# Patient Record
Sex: Male | Born: 1960 | Race: White | Hispanic: No | Marital: Single | Smoking: Current every day smoker
Health system: Southern US, Community
[De-identification: ages and names within clinical notes are randomized; demographics above are authoritative.]

## PROBLEM LIST (undated history)

## (undated) DIAGNOSIS — E119 Type 2 diabetes mellitus without complications: Secondary | ICD-10-CM

## (undated) DIAGNOSIS — I10 Essential (primary) hypertension: Secondary | ICD-10-CM

## (undated) DIAGNOSIS — F419 Anxiety disorder, unspecified: Secondary | ICD-10-CM

## (undated) DIAGNOSIS — J45909 Unspecified asthma, uncomplicated: Secondary | ICD-10-CM

## (undated) DIAGNOSIS — I639 Cerebral infarction, unspecified: Secondary | ICD-10-CM

## (undated) DIAGNOSIS — K219 Gastro-esophageal reflux disease without esophagitis: Secondary | ICD-10-CM

## (undated) DIAGNOSIS — J449 Chronic obstructive pulmonary disease, unspecified: Secondary | ICD-10-CM

---

## 2011-11-01 ENCOUNTER — Other Ambulatory Visit: Payer: Self-pay | Admitting: Specialist

## 2011-11-01 DIAGNOSIS — N281 Cyst of kidney, acquired: Secondary | ICD-10-CM

## 2011-11-04 ENCOUNTER — Ambulatory Visit
Admission: RE | Admit: 2011-11-04 | Discharge: 2011-11-04 | Disposition: A | Discharge status: Home / Self Care | Payer: Medicare Other | Source: Ambulatory Visit | Attending: Specialist | Admitting: Specialist

## 2011-11-04 DIAGNOSIS — N281 Cyst of kidney, acquired: Secondary | ICD-10-CM

## 2012-12-18 ENCOUNTER — Encounter (HOSPITAL_COMMUNITY): Payer: Self-pay | Admitting: Emergency Medicine

## 2012-12-18 ENCOUNTER — Other Ambulatory Visit: Payer: Self-pay

## 2012-12-18 ENCOUNTER — Emergency Department (HOSPITAL_COMMUNITY)
Admission: EM | Admit: 2012-12-18 | Discharge: 2012-12-18 | Discharge status: Absent without leave | Payer: Medicare Other | Source: Home / Self Care | Attending: Emergency Medicine | Admitting: Emergency Medicine

## 2012-12-18 ENCOUNTER — Other Ambulatory Visit: Payer: Self-pay | Admitting: Emergency Medicine

## 2012-12-18 ENCOUNTER — Emergency Department (HOSPITAL_COMMUNITY)
Admission: EM | Admit: 2012-12-18 | Discharge: 2012-12-19 | Disposition: A | Discharge status: Home / Self Care | Payer: Medicare Other | Attending: Emergency Medicine | Admitting: Emergency Medicine

## 2012-12-18 DIAGNOSIS — F1920 Other psychoactive substance dependence, uncomplicated: Secondary | ICD-10-CM

## 2012-12-18 DIAGNOSIS — R4789 Other speech disturbances: Secondary | ICD-10-CM | POA: Insufficient documentation

## 2012-12-18 DIAGNOSIS — R739 Hyperglycemia, unspecified: Secondary | ICD-10-CM

## 2012-12-18 DIAGNOSIS — Z79899 Other long term (current) drug therapy: Secondary | ICD-10-CM | POA: Insufficient documentation

## 2012-12-18 DIAGNOSIS — I10 Essential (primary) hypertension: Secondary | ICD-10-CM | POA: Insufficient documentation

## 2012-12-18 DIAGNOSIS — E1169 Type 2 diabetes mellitus with other specified complication: Secondary | ICD-10-CM | POA: Insufficient documentation

## 2012-12-18 DIAGNOSIS — F17200 Nicotine dependence, unspecified, uncomplicated: Secondary | ICD-10-CM | POA: Insufficient documentation

## 2012-12-18 DIAGNOSIS — F411 Generalized anxiety disorder: Secondary | ICD-10-CM | POA: Insufficient documentation

## 2012-12-18 HISTORY — DX: Essential (primary) hypertension: I10

## 2012-12-18 HISTORY — DX: Type 2 diabetes mellitus without complications: E11.9

## 2012-12-18 HISTORY — DX: Anxiety disorder, unspecified: F41.9

## 2012-12-18 LAB — RAPID URINE DRUG SCREEN, HOSP PERFORMED
Amphetamines: NOT DETECTED
Amphetamines: NOT DETECTED
Barbiturates: NOT DETECTED
Benzodiazepines: POSITIVE — AB
Benzodiazepines: POSITIVE — AB
Cocaine: NOT DETECTED
Cocaine: NOT DETECTED
Opiates: POSITIVE — AB
Tetrahydrocannabinol: NOT DETECTED

## 2012-12-18 LAB — GLUCOSE, CAPILLARY: Glucose-Capillary: 387 mg/dL — ABNORMAL HIGH (ref 70–99)

## 2012-12-18 LAB — CBC WITH DIFFERENTIAL/PLATELET
Basophils Absolute: 0 10*3/uL (ref 0.0–0.1)
Basophils Relative: 0 % (ref 0–1)
Eosinophils Relative: 2 % (ref 0–5)
Lymphocytes Relative: 49 % — ABNORMAL HIGH (ref 12–46)
MCHC: 35.5 g/dL (ref 30.0–36.0)
MCV: 82.9 fL (ref 78.0–100.0)
Monocytes Absolute: 0.6 10*3/uL (ref 0.1–1.0)
Neutro Abs: 4.1 10*3/uL (ref 1.7–7.7)
Platelets: 188 10*3/uL (ref 150–400)
RDW: 13.5 % (ref 11.5–15.5)
WBC: 9.5 10*3/uL (ref 4.0–10.5)

## 2012-12-18 LAB — COMPREHENSIVE METABOLIC PANEL
ALT: 27 U/L (ref 0–53)
ALT: 26 U/L (ref 0–53)
AST: 15 U/L (ref 0–37)
AST: 15 U/L (ref 0–37)
Albumin: 3.6 g/dL (ref 3.5–5.2)
Albumin: 3.3 g/dL — ABNORMAL LOW (ref 3.5–5.2)
CO2: 25 mEq/L (ref 19–32)
Calcium: 9.3 mg/dL (ref 8.4–10.5)
Calcium: 9.1 mg/dL (ref 8.4–10.5)
Chloride: 96 mEq/L (ref 96–112)
Creatinine, Ser: 0.67 mg/dL (ref 0.50–1.35)
Creatinine, Ser: 0.64 mg/dL (ref 0.50–1.35)
GFR calc non Af Amer: >90 mL/min (ref >90)
Sodium: 131 mEq/L — ABNORMAL LOW (ref 135–145)
Sodium: 136 mEq/L (ref 135–145)

## 2012-12-18 LAB — URINALYSIS, ROUTINE W REFLEX MICROSCOPIC
Bilirubin Urine: NEGATIVE
Glucose, UA: >1000 mg/dL — AB
Hgb urine dipstick: NEGATIVE
Ketones, ur: NEGATIVE mg/dL
Protein, ur: NEGATIVE mg/dL
Urobilinogen, UA: 0.2 mg/dL (ref 0.0–1.0)

## 2012-12-18 LAB — CBC
MCH: 29.1 pg (ref 26.0–34.0)
MCV: 82.3 fL (ref 78.0–100.0)
Platelets: 202 10*3/uL (ref 150–400)
RBC: 4.81 MIL/uL (ref 4.22–5.81)
RDW: 13.4 % (ref 11.5–15.5)
WBC: 9.7 10*3/uL (ref 4.0–10.5)

## 2012-12-18 LAB — ACETAMINOPHEN LEVEL
Acetaminophen (Tylenol), Serum: <15 ug/mL (ref 10–30)
Acetaminophen (Tylenol), Serum: <15 ug/mL (ref 10–30)

## 2012-12-18 LAB — SALICYLATE LEVEL: Salicylate Lvl: <2 mg/dL — ABNORMAL LOW (ref 2.8–20.0)

## 2012-12-18 MED ORDER — ACETAMINOPHEN 325 MG PO TABS
650.0000 mg | ORAL_TABLET | Freq: Once | ORAL | Status: DC
  Filled 2012-12-18: qty 2

## 2012-12-18 MED ORDER — SODIUM CHLORIDE 0.9 % IV BOLUS (SEPSIS): 1000.0000 mL | Freq: Once | INTRAVENOUS | Status: DC

## 2012-12-18 MED ORDER — SODIUM CHLORIDE 0.9 % IV SOLN: Freq: Once | INTRAVENOUS | Status: DC

## 2012-12-18 NOTE — ED Notes (Signed)
WUJ:[id number]<JY> Expected date:<BR> Expected time:<BR> Means of arrival:<BR> Comments:<BR> EMS-OD

## 2012-12-18 NOTE — ED Notes (Signed)
EKG given to EDP, Ghim, MD.

## 2012-12-18 NOTE — ED Provider Notes (Signed)
CSN: [id number]     Arrival date & time 12/18/12  1916 History     First MD Initiated Contact with Patient 12/18/12 2042     No chief complaint on file.  (Consider location/radiation/quality/duration/timing/severity/associated sxs/prior Treatment) HPI  Patient is a 52 year old male presented to the department for possible drug overdose and ETOH use. Patient states that he took "what he was supposed to take" of his home medications at once last evening without the intent to harm himself. Patient does state that sometimes he does take his home medications for a high. Patient denies any recreational drugs but does endorse alcohol use. Patient is denying any suicidal ideation currently. Denies homicidal ideation, visual or auditory hallucinations. No physical complaints at this time. Patient is a level V Caveat d/t intoxication.  Past Medical History  Diagnosis Date  . Diabetes mellitus without complication   . Hypertension   . Anxiety    History reviewed. No pertinent past surgical history. No family history on file. History  Substance Use Topics  . Smoking status: Current Every Day Smoker    Types: Cigarettes  . Smokeless tobacco: Not on file  . Alcohol Use: Yes     Comment: Unk     Review of Systems  Unable to perform ROS: Other    Allergies  Review of patient's allergies indicates no known allergies.  Home Medications   Current Outpatient Rx  Name  Route  Sig  Dispense  Refill  . ALPRAZolam (XANAX) 1 MG tablet   Oral   Take 1 mg by mouth 2 (two) times daily as needed for anxiety.          Marland Kitchen dexlansoprazole (DEXILANT) 60 MG capsule   Oral   Take 60 mg by mouth daily.         Marland Kitchen HYDROcodone-acetaminophen (NORCO) 10-325 MG per tablet   Oral   Take 1 tablet by mouth 2 (two) times daily.         . metoprolol succinate (TOPROL-XL) 50 MG 24 hr tablet   Oral   Take 50 mg by mouth daily. Take with or immediately following a meal.         . pregabalin (LYRICA) 75  MG capsule   Oral   Take 75 mg by mouth 2 (two) times daily.          BP 132/72  Pulse 80  Temp(Src) 98.1 F (36.7 C) (Oral)  Resp 16  SpO2 97% Physical Exam  Constitutional: He is oriented to person, place, and time. He appears well-developed and well-nourished. No distress.  HENT:  Head: Normocephalic and atraumatic.  Mouth/Throat: Oropharynx is clear and moist.  Eyes: Conjunctivae and EOM are normal. Pupils are equal, round, and reactive to light.  Neck: Neck supple.  Cardiovascular: Normal rate, regular rhythm, normal heart sounds and intact distal pulses.   Pulmonary/Chest: Effort normal and breath sounds normal.  Abdominal: Soft. There is no tenderness.  Neurological: He is alert and oriented to person, place, and time. No cranial nerve deficit.  Skin: Skin is warm and dry. He is not diaphoretic.  Psychiatric: His speech is slurred. He is slowed. He expresses no homicidal and no suicidal ideation. He expresses no suicidal plans and no homicidal plans.    ED Course   Procedures (including critical care time)  Medications  0.9 %  sodium chloride infusion ( Intravenous Stopped 12/18/12 2211)    Labs Reviewed  GLUCOSE, CAPILLARY - Abnormal; Notable for the following:    Glucose-Capillary  363 (*)    All other components within normal limits  CBC WITH DIFFERENTIAL - Abnormal; Notable for the following:    HCT 38.9 (*)    Lymphocytes Relative 49 (*)    Lymphs Abs 4.6 (*)    All other components within normal limits  COMPREHENSIVE METABOLIC PANEL - Abnormal; Notable for the following:    Glucose, Bld 325 (*)    Albumin 3.3 (*)    All other components within normal limits  URINE RAPID DRUG SCREEN (HOSP PERFORMED) - Abnormal; Notable for the following:    Opiates POSITIVE (*)    Benzodiazepines POSITIVE (*)    All other components within normal limits  SALICYLATE LEVEL - Abnormal; Notable for the following:    Salicylate Lvl <2.0 (*)    All other components within  normal limits  URINALYSIS, ROUTINE W REFLEX MICROSCOPIC - Abnormal; Notable for the following:    Specific Gravity, Urine >1.046 (*)    Glucose, UA >1000 (*)    All other components within normal limits  GLUCOSE, CAPILLARY - Abnormal; Notable for the following:    Glucose-Capillary 319 (*)    All other components within normal limits  ETHANOL  ACETAMINOPHEN LEVEL  URINE MICROSCOPIC-ADD ON   No results found. 1. Drug dependence   2. Hyperglycemia without ketosis     MDM  Pt presenting for possible overdose of home medications last evening along with ETOH use. On further evaluation of patient sounds as though he took his medications for recreational purposes and not with suicidal intent. Pt has not endorsed SI at any point during his course in the ED. No concern for SI. ACT consulted on patient and agreed that patient was not suicidal and did not feel need for further evaluation. Patient AAOx4. PE unremarkable. Labs reviewed. Hyperglycemia responded with IVF. No anion gap. Patient clinically sober at time of discharge. Discussed with patient importance of taking his medications as scheduled and not recreationally. Return precautions discussed. Patient is agreeable to plan. Patient d/w with Dr. Fredderick Phenix, agrees with plan.     Lise Auer Rowyn Spilde, PA-C 12/19/12 0101

## 2012-12-18 NOTE — ED Notes (Addendum)
Per EMS pt. From home, pt. Reported by family   to have taken unknown number of different medications since this morning and found pt. Lethargic, slurred speech also noted. Pt. SOB, pt. Oriented to self and what had happened.pt. Admitted of intent of overdosing those medications and hurt himself.

## 2012-12-18 NOTE — ED Notes (Signed)
Contacted Poison Control-pill count of pt's meds noted to be correct-spoke with Deborah-states to monitor and obtain medical clearance labs

## 2012-12-18 NOTE — ED Notes (Signed)
Notified RN, Lilibeth pt. CBG [phone].

## 2012-12-18 NOTE — ED Notes (Signed)
Per EMS, pt. Is from home who was reported by family of taking unknown number of different medications since this morning. Pt. Was found lethargic with slurred speech but no SOB nor pain reported. Pt. Admitted to have intent of hurting himself. No s/s of N/V. Pt. Also claimed of drinking beers and liquor today.

## 2012-12-18 NOTE — ED Notes (Signed)
EKG given to EDP,Belfi,MD. 

## 2012-12-19 ENCOUNTER — Encounter (HOSPITAL_COMMUNITY): Payer: Self-pay | Admitting: *Deleted

## 2012-12-19 NOTE — ED Notes (Signed)
Updated Poison Control Office of pt.'s lab results and condition, suggested to keep pt. Until fully awake and able to ambulate. To notify MD. Pt. Still asleep , easily arouseable to calling but still cant stay awake.

## 2012-12-19 NOTE — BH Assessment (Signed)
Assessment Note   Stephen Mann is a 52 y.o. male who presents to [facility] for possible SI attempt by overdose and alcohol intoxication.  Pt states he's been out of jail since 12/13/12 after serving 4 mos for assault.  Pt says he was supposed to return home and live with family members, but they "turned their backs on him" and were not willing to help him.  Pt admits to harming himself at 2x's in the past by cutting his wrists mainly for attention, but denies any such intent at this time.  Pt states that he'd been drinking a 6 pack of beer before arriving at the [facility].  Pt c/o not having a place to go and that his family is "two-faced".  This Clinical research associate discussed disposition with Marice Potter, PA and pt will be d/c from [facility].     Axis I: Alcohol abuse  Axis II: Deferred Axis III:  Past Medical History  Diagnosis Date  . Diabetes mellitus without complication   . Hypertension   . Anxiety    Axis IV: economic problems, housing problems, occupational problems, other psychosocial or environmental problems, problems related to legal system/crime, problems related to social environment and problems with primary support group Axis V: 61-70 mild symptoms  Past Medical History:  Past Medical History  Diagnosis Date  . Diabetes mellitus without complication   . Hypertension   . Anxiety     History reviewed. No pertinent past surgical history.  Family History: No family history on file.  Social History:  reports that he has been smoking Cigarettes.  He has been smoking about 0.00 packs per day. He does not have any smokeless tobacco history on file. He reports that  drinks alcohol. His drug history is not on file.  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;Legal;Financial Withdrawal Symptoms: Other (Comment) (No w/d sxs ) Substance #1 Name of Substance 1: Alcohol  1 - Age of First Use: Teens  1 - Amount (size/oz): Unk  1 - Frequency: Unk  1 - Duration: On-going  1 - Last Use / Amount: 12/17/12  CIWA: CIWA-Ar BP: 133/58 mmHg Pulse Rate: 84 Nausea and Vomiting: no nausea and no vomiting Tactile Disturbances: none Tremor: no tremor Auditory Disturbances: not present Paroxysmal Sweats: no sweat visible Visual Disturbances: not present Anxiety: no anxiety, at ease Headache, Fullness in Head: none present Agitation: normal activity Orientation and Clouding of Sensorium: oriented and can do serial additions CIWA-Ar Total: 0 COWS:    Allergies: No Known Allergies  Home Medications:  (Not in a hospital admission)  OB/GYN Status:  No LMP for male patient.  General Assessment Data Location of Assessment: [facility] Living Arrangements: Other (Comment) (Homeless ) Can pt return to current living arrangement?: Yes Admission Status: Voluntary Is patient capable of signing voluntary admission?: Yes Transfer from: Acute Hospital Referral Source: MD  Education Status Is patient currently in school?: No Current Grade: None  Highest grade of school patient has completed: None  Name of school: None  Contact person: None   Risk to self Suicidal Ideation: No Suicidal Intent: No Is patient at risk for suicide?: No Suicidal Plan?: No Access to Means: No What has been your use of drugs/alcohol within the last 12 months?: Abusing: alcohol  Previous Attempts/Gestures: No How many times?: 0 Other Self Harm Risks: None  Intentional Self Injurious Behavior: None Family Suicide History: No Recent stressful life event(s): Other (Comment);Financial Problems;Legal Issues (Released from jail 12/13/12; homeless ) Persecutory voices/beliefs?: No Depression: Yes Depression Symptoms: Feeling angry/irritable;Loss of interest in usual  pleasures;Feeling worthless/self pity Substance abuse history and/or treatment for substance abuse?: Yes Suicide prevention information given to non-admitted patients: Not applicable  Risk to Others Homicidal Ideation: No Thoughts of Harm to Others: No Current Homicidal Intent: No Current Homicidal Plan: No Access to Homicidal Means: No Identified Victim: None  History of harm to others?: No Assessment of Violence: None Noted Violent Behavior Description: None  Does patient have access to weapons?: No Criminal Charges Pending?: No Does patient have a court date: No  Psychosis Hallucinations: None noted Delusions: None noted  Mental Status Report Appear/Hygiene: Disheveled;Poor hygiene Eye Contact: Poor Motor Activity: Unremarkable Speech: Logical/coherent;Slurred;Slow Level of Consciousness: Drowsy Mood: Irritable Affect: Irritable Anxiety Level: None Thought Processes: Coherent;Relevant Judgement: Unimpaired Orientation: Person;Place;Time;Situation Obsessive Compulsive Thoughts/Behaviors: None  Cognitive Functioning Concentration: Normal Memory: Recent Intact;Remote Intact IQ: Average Insight: Fair Impulse Control: Fair Appetite: Good Weight Loss: 0 Weight Gain: 0 Sleep: No Change Total Hours of Sleep: 8 Vegetative Symptoms: None  ADLScreening [facility] Assessment Services) Patient's cognitive ability adequate to safely complete daily activities?: Yes Patient able to express need for assistance with ADLs?: Yes Independently performs ADLs?: Yes (appropriate for developmental age)  Abuse/Neglect [facility]) Physical Abuse: Denies Verbal Abuse: Denies Sexual Abuse: Denies  Prior Inpatient Therapy Prior Inpatient Therapy: No Prior Therapy Dates: None  Prior Therapy Facilty/Provider(s): None  Reason for Treatment: None   Prior Outpatient Therapy Prior Outpatient Therapy: No Prior Therapy Dates: None  Prior Therapy Facilty/Provider(s): None  Reason for Treatment:  None   ADL Screening (condition at time of admission) Patient's cognitive ability adequate to safely complete daily activities?: Yes Is the patient deaf or have difficulty hearing?: No Does the patient have difficulty seeing, even when wearing glasses/contacts?: No Does the patient have difficulty concentrating, remembering, or making decisions?: No Patient able to express need for assistance with ADLs?: Yes Does the patient have difficulty dressing or bathing?: No Independently performs ADLs?: Yes (appropriate for developmental age) Does the patient have difficulty walking or climbing stairs?: No Weakness of Legs: None Weakness of Arms/Hands: None  Home Assistive Devices/Equipment Home Assistive Devices/Equipment: None  Therapy Consults (therapy consults require a physician order) PT Evaluation Needed: No OT Evalulation Needed: No SLP Evaluation Needed: No Abuse/Neglect Assessment (Assessment to be complete while patient is alone) Physical Abuse: Denies Verbal Abuse: Denies Sexual Abuse: Denies Exploitation of patient/patient's resources: Denies Self-Neglect: Denies Values / Beliefs Cultural Requests During Hospitalization: None Spiritual Requests During Hospitalization: None Consults Spiritual Care Consult Needed: No Social Work Consult Needed: No Merchant navy officer (For Healthcare) Advance Directive: Patient does not have advance directive;Patient would not like information Pre-existing out of facility DNR order (yellow form or pink MOST form): No Nutrition Screen- [facility] Patient's home diet: Regular  Additional Information 1:1 In Past 12 Months?: No CIRT Risk: No Elopement Risk: No Does patient have medical clearance?: Yes     Disposition:  Disposition Initial Assessment Completed for this Encounter: Yes Disposition of Patient: Referred to (D/C by EDP ) Patient referred to: Other (Comment) (D/C by EDP )  On Site Evaluation by:   Reviewed with  Physician:     Murrell Redden 12/19/2012 5:35 AM

## 2012-12-19 NOTE — ED Provider Notes (Signed)
Medical screening examination/treatment/procedure(s) were conducted as a shared visit with non-physician practitioner(s) and myself.  I personally evaluated the patient during the encounter   Rolan Bucco, MD 12/19/12 [phone]

## 2012-12-19 NOTE — ED Notes (Signed)
Pt.'s medications kept in pharmacy.

## 2012-12-19 NOTE — ED Notes (Addendum)
Nursing communication documented in error.

## 2012-12-19 NOTE — ED Notes (Addendum)
Pt. Is fully awake, oriented x3, ambulatory within pt.'s room  Without difficulty, verbalized desire to "go home now" .  Able to put on clothes by himself.

## 2012-12-19 NOTE — ED Notes (Signed)
Pt. Pending for discharge, unable to stay awake , no family at bedside, claimed no ride access at this time. Dr. Norlene Campbell notified, to keep pt. In his room until fully awake  for discharge to home.

## 2012-12-21 LAB — GLUCOSE, CAPILLARY
Glucose-Capillary: 387 mg/dL — ABNORMAL HIGH (ref 70–99)
Glucose-Capillary: 387 mg/dL — ABNORMAL HIGH (ref 70–99)

## 2013-05-08 IMAGING — US US RENAL
1 series · 14 of 25 positions shown · non-contrast
Comparison: CT of the abdomen pelvis from [facility] dated
10/30/2011

CLINICAL DATA: Follow up of left renal cyst noted on CT from
[facility]

RENAL/URINARY TRACT ULTRASOUND COMPLETE

[Series 1: us renal · 0.28mm/px · 14 of 32 slices shown]
[im 1/32]
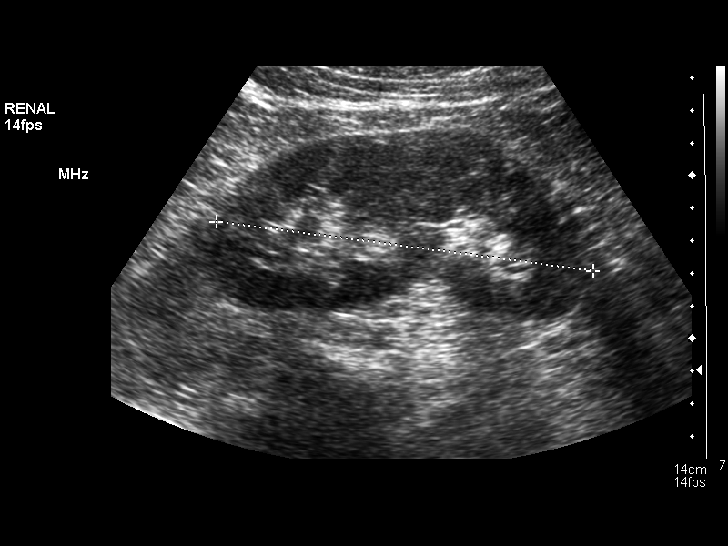
[im 3/32]
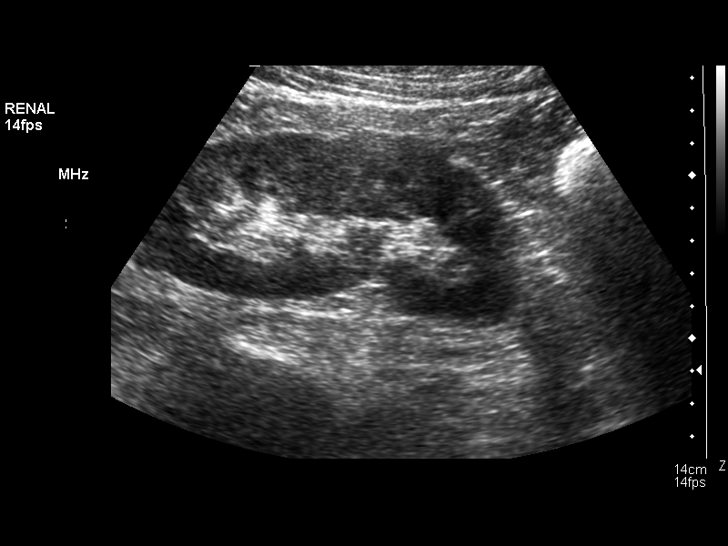
[im 6/32]
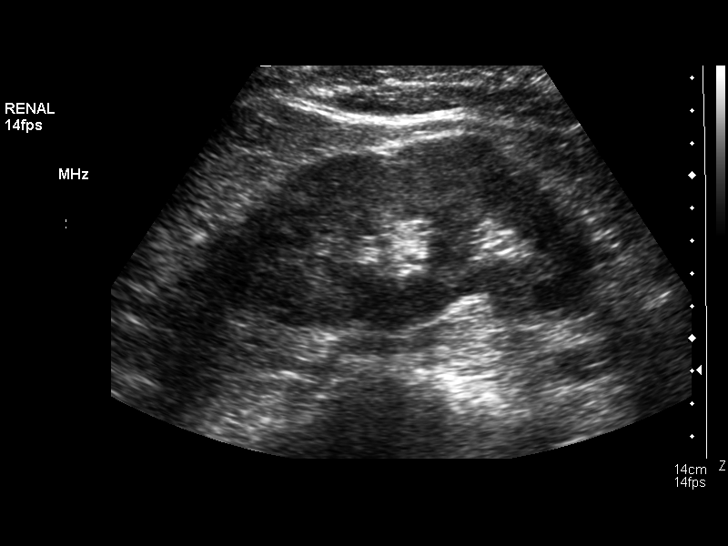
[im 8/32]
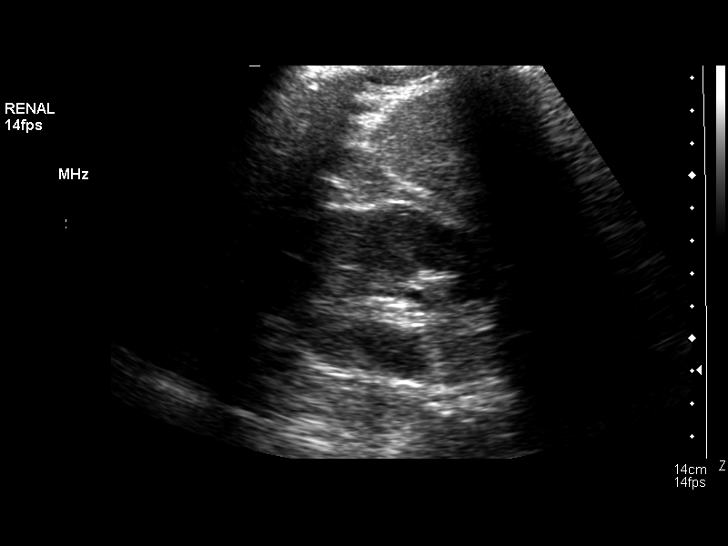
[im 11/32]
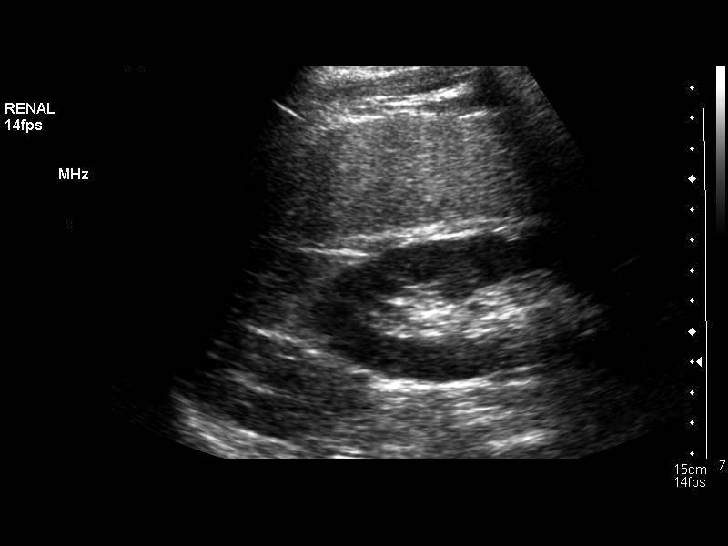
[im 12/32]
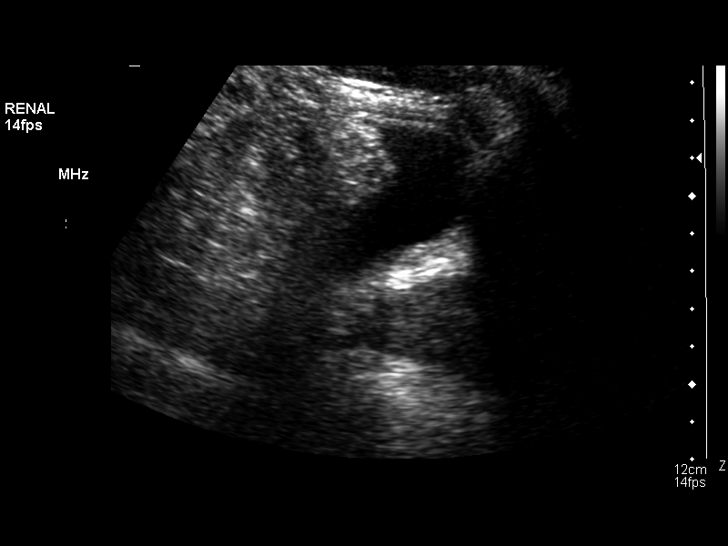
[im 15/32]
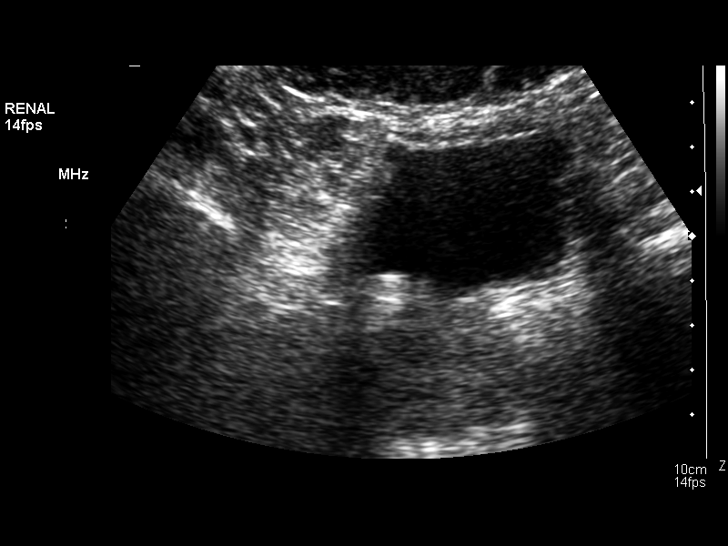
[im 17/32]
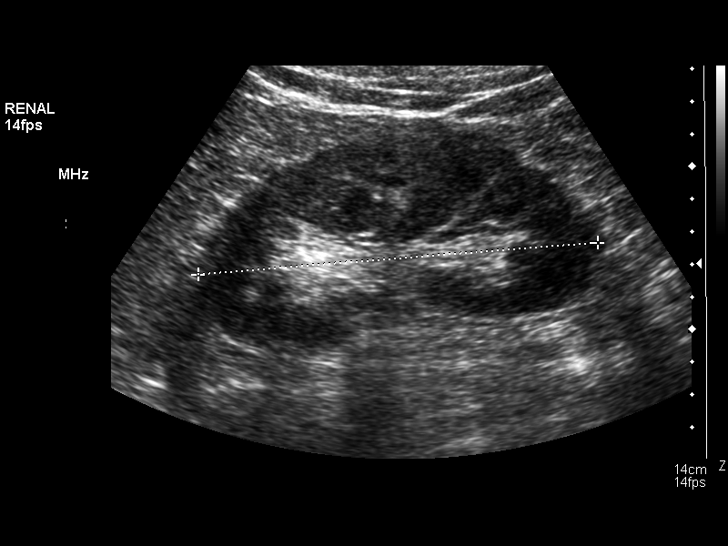
[im 20/32]
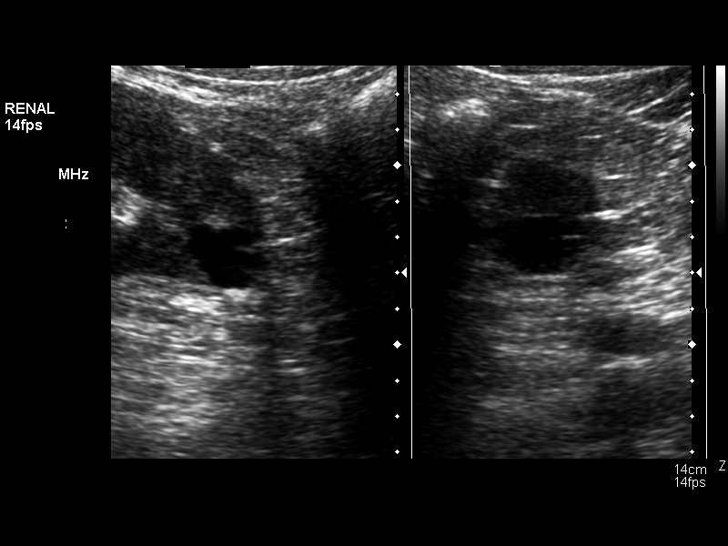
[im 21/32]
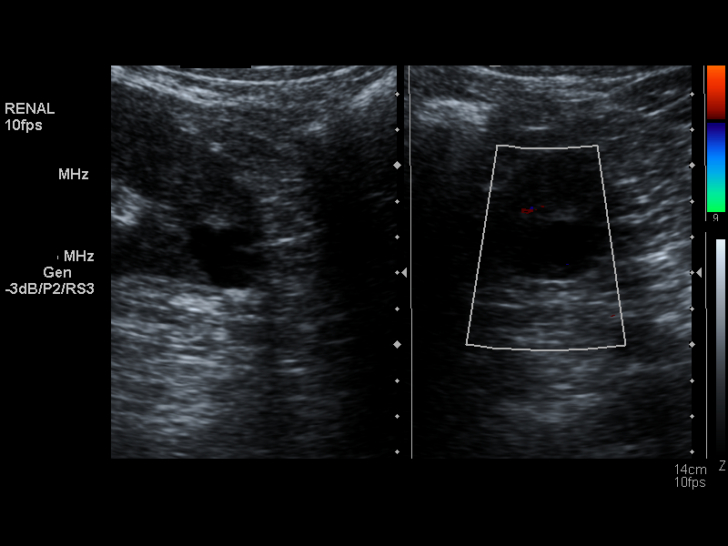
[im 24/32]
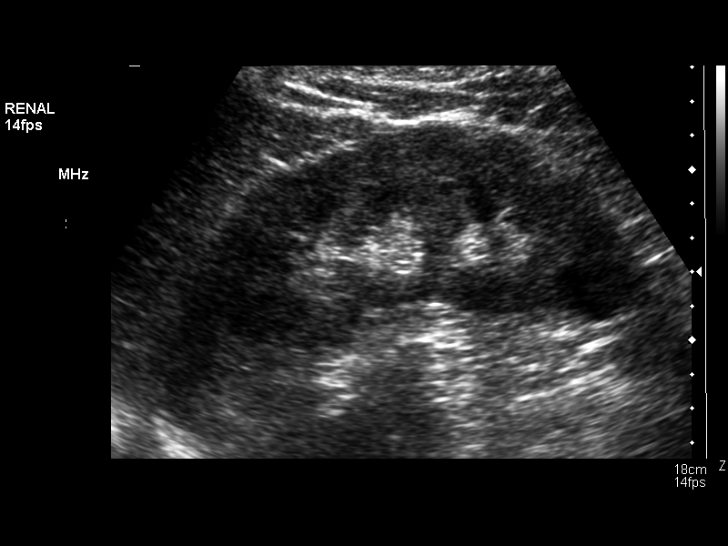
[im 26/32]
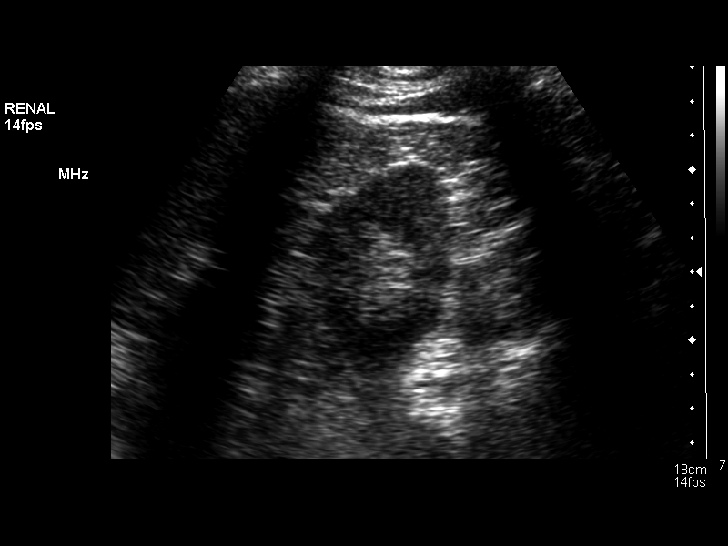
[im 29/32]
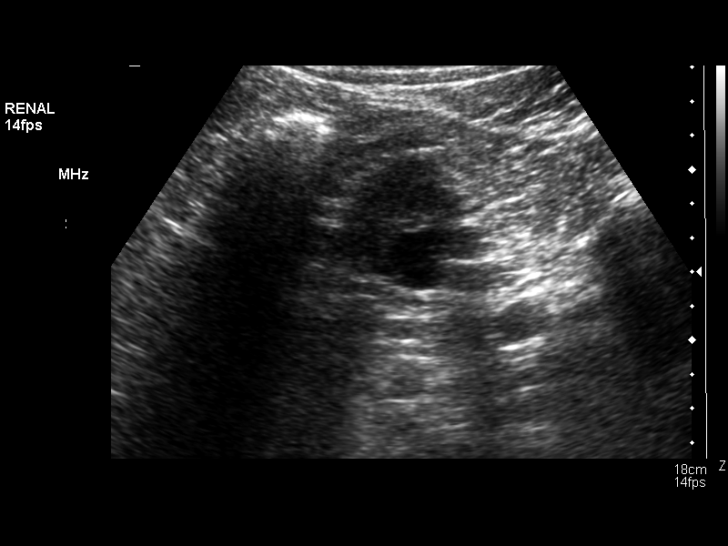
[im 32/32]
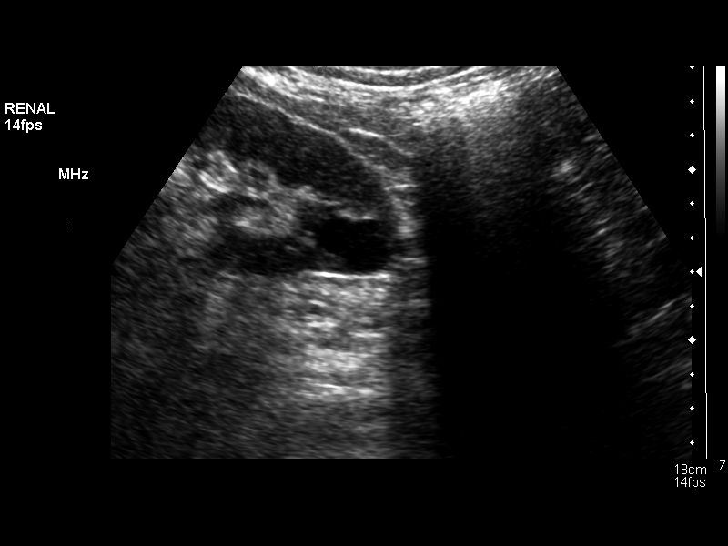

[14 of 25 positions shown; findings below may reference images not displayed]

FINDINGS: Right Kidney:  No hydronephrosis is seen.  The right kidney
measures 11.7 cm sagittally.

Left Kidney:  No hydronephrosis is seen.  The left kidney measures
12.4 cm.  In the lower pole of the left kidney there is a
hypoechoic structure with increased through transmission consistent
with a cyst of 2.4 x 2.2 x 1.8 cm.  No solid renal lesion is seen.

Bladder:  The urinary bladder is not well distended but is
unremarkable.
IMPRESSION: The area questioned in the lower pole of the left kidney is
consistent with a cyst by ultrasound.  No solid renal lesion is
seen.

## 2017-03-01 DIAGNOSIS — F17210 Nicotine dependence, cigarettes, uncomplicated: Secondary | ICD-10-CM | POA: Diagnosis not present

## 2017-03-01 DIAGNOSIS — M5442 Lumbago with sciatica, left side: Secondary | ICD-10-CM | POA: Diagnosis not present

## 2017-03-01 DIAGNOSIS — I10 Essential (primary) hypertension: Secondary | ICD-10-CM | POA: Diagnosis not present

## 2017-03-01 DIAGNOSIS — G8929 Other chronic pain: Secondary | ICD-10-CM | POA: Diagnosis not present

## 2017-03-01 DIAGNOSIS — J449 Chronic obstructive pulmonary disease, unspecified: Secondary | ICD-10-CM | POA: Diagnosis not present

## 2017-03-12 DIAGNOSIS — Z760 Encounter for issue of repeat prescription: Secondary | ICD-10-CM | POA: Diagnosis not present

## 2017-03-12 DIAGNOSIS — R0600 Dyspnea, unspecified: Secondary | ICD-10-CM | POA: Diagnosis not present

## 2017-03-18 DIAGNOSIS — J449 Chronic obstructive pulmonary disease, unspecified: Secondary | ICD-10-CM | POA: Diagnosis not present

## 2017-03-18 DIAGNOSIS — K219 Gastro-esophageal reflux disease without esophagitis: Secondary | ICD-10-CM | POA: Diagnosis not present

## 2017-03-18 DIAGNOSIS — E782 Mixed hyperlipidemia: Secondary | ICD-10-CM | POA: Diagnosis not present

## 2017-03-18 DIAGNOSIS — I10 Essential (primary) hypertension: Secondary | ICD-10-CM | POA: Diagnosis not present

## 2017-03-18 DIAGNOSIS — E669 Obesity, unspecified: Secondary | ICD-10-CM | POA: Diagnosis not present

## 2017-03-18 DIAGNOSIS — E1165 Type 2 diabetes mellitus with hyperglycemia: Secondary | ICD-10-CM | POA: Diagnosis not present

## 2017-03-18 DIAGNOSIS — E1140 Type 2 diabetes mellitus with diabetic neuropathy, unspecified: Secondary | ICD-10-CM | POA: Diagnosis not present

## 2017-03-18 DIAGNOSIS — Z1389 Encounter for screening for other disorder: Secondary | ICD-10-CM | POA: Diagnosis not present

## 2017-03-18 DIAGNOSIS — M1990 Unspecified osteoarthritis, unspecified site: Secondary | ICD-10-CM | POA: Diagnosis not present

## 2017-03-18 DIAGNOSIS — N400 Enlarged prostate without lower urinary tract symptoms: Secondary | ICD-10-CM | POA: Diagnosis not present

## 2017-04-01 DIAGNOSIS — F29 Unspecified psychosis not due to a substance or known physiological condition: Secondary | ICD-10-CM | POA: Diagnosis not present

## 2017-04-11 DIAGNOSIS — S0240CA Maxillary fracture, right side, initial encounter for closed fracture: Secondary | ICD-10-CM | POA: Diagnosis not present

## 2017-04-11 DIAGNOSIS — S20222A Contusion of left back wall of thorax, initial encounter: Secondary | ICD-10-CM | POA: Diagnosis not present

## 2017-04-11 DIAGNOSIS — R220 Localized swelling, mass and lump, head: Secondary | ICD-10-CM | POA: Diagnosis not present

## 2017-04-11 DIAGNOSIS — Y999 Unspecified external cause status: Secondary | ICD-10-CM | POA: Diagnosis not present

## 2017-04-11 DIAGNOSIS — S065X0A Traumatic subdural hemorrhage without loss of consciousness, initial encounter: Secondary | ICD-10-CM | POA: Diagnosis not present

## 2017-04-11 DIAGNOSIS — S0280XA Fracture of other specified skull and facial bones, unspecified side, initial encounter for closed fracture: Secondary | ICD-10-CM | POA: Diagnosis not present

## 2017-04-11 DIAGNOSIS — R51 Headache: Secondary | ICD-10-CM | POA: Diagnosis not present

## 2017-04-11 DIAGNOSIS — S299XXA Unspecified injury of thorax, initial encounter: Secondary | ICD-10-CM | POA: Diagnosis not present

## 2017-04-11 DIAGNOSIS — S3993XA Unspecified injury of pelvis, initial encounter: Secondary | ICD-10-CM | POA: Diagnosis not present

## 2017-04-11 DIAGNOSIS — S02612A Fracture of condylar process of left mandible, initial encounter for closed fracture: Secondary | ICD-10-CM | POA: Diagnosis not present

## 2017-04-11 DIAGNOSIS — S02401A Maxillary fracture, unspecified, initial encounter for closed fracture: Secondary | ICD-10-CM | POA: Diagnosis not present

## 2017-04-11 DIAGNOSIS — S0231XA Fracture of orbital floor, right side, initial encounter for closed fracture: Secondary | ICD-10-CM | POA: Diagnosis not present

## 2017-04-11 DIAGNOSIS — S0232XA Fracture of orbital floor, left side, initial encounter for closed fracture: Secondary | ICD-10-CM | POA: Diagnosis not present

## 2017-04-11 DIAGNOSIS — S199XXA Unspecified injury of neck, initial encounter: Secondary | ICD-10-CM | POA: Diagnosis not present

## 2017-04-11 DIAGNOSIS — S02609A Fracture of mandible, unspecified, initial encounter for closed fracture: Secondary | ICD-10-CM | POA: Diagnosis not present

## 2017-04-11 DIAGNOSIS — S3991XA Unspecified injury of abdomen, initial encounter: Secondary | ICD-10-CM | POA: Diagnosis not present

## 2017-04-11 DIAGNOSIS — S065X9A Traumatic subdural hemorrhage with loss of consciousness of unspecified duration, initial encounter: Secondary | ICD-10-CM | POA: Diagnosis not present

## 2017-04-11 DIAGNOSIS — Y92481 Parking lot as the place of occurrence of the external cause: Secondary | ICD-10-CM | POA: Diagnosis not present

## 2017-04-12 DIAGNOSIS — S065X9A Traumatic subdural hemorrhage with loss of consciousness of unspecified duration, initial encounter: Secondary | ICD-10-CM | POA: Diagnosis not present

## 2017-04-12 DIAGNOSIS — S3993XA Unspecified injury of pelvis, initial encounter: Secondary | ICD-10-CM | POA: Diagnosis not present

## 2017-04-12 DIAGNOSIS — S3991XA Unspecified injury of abdomen, initial encounter: Secondary | ICD-10-CM | POA: Diagnosis not present

## 2017-04-12 DIAGNOSIS — S0280XA Fracture of other specified skull and facial bones, unspecified side, initial encounter for closed fracture: Secondary | ICD-10-CM | POA: Diagnosis not present

## 2017-04-12 DIAGNOSIS — Y999 Unspecified external cause status: Secondary | ICD-10-CM | POA: Diagnosis not present

## 2017-04-12 DIAGNOSIS — S02401A Maxillary fracture, unspecified, initial encounter for closed fracture: Secondary | ICD-10-CM | POA: Diagnosis not present

## 2017-04-12 DIAGNOSIS — S0231XA Fracture of orbital floor, right side, initial encounter for closed fracture: Secondary | ICD-10-CM | POA: Diagnosis not present

## 2017-04-12 DIAGNOSIS — I6200 Nontraumatic subdural hemorrhage, unspecified: Secondary | ICD-10-CM | POA: Diagnosis not present

## 2017-04-12 DIAGNOSIS — S02612A Fracture of condylar process of left mandible, initial encounter for closed fracture: Secondary | ICD-10-CM | POA: Diagnosis not present

## 2017-04-12 DIAGNOSIS — S065X0A Traumatic subdural hemorrhage without loss of consciousness, initial encounter: Secondary | ICD-10-CM | POA: Diagnosis not present

## 2017-04-12 DIAGNOSIS — S0240CA Maxillary fracture, right side, initial encounter for closed fracture: Secondary | ICD-10-CM | POA: Diagnosis not present

## 2017-04-12 DIAGNOSIS — S199XXA Unspecified injury of neck, initial encounter: Secondary | ICD-10-CM | POA: Diagnosis not present

## 2017-04-12 DIAGNOSIS — Y92481 Parking lot as the place of occurrence of the external cause: Secondary | ICD-10-CM | POA: Diagnosis not present

## 2017-04-12 DIAGNOSIS — I629 Nontraumatic intracranial hemorrhage, unspecified: Secondary | ICD-10-CM | POA: Diagnosis not present

## 2017-04-12 DIAGNOSIS — S299XXA Unspecified injury of thorax, initial encounter: Secondary | ICD-10-CM | POA: Diagnosis not present

## 2017-04-12 DIAGNOSIS — R51 Headache: Secondary | ICD-10-CM | POA: Diagnosis not present

## 2017-04-21 DIAGNOSIS — R42 Dizziness and giddiness: Secondary | ICD-10-CM | POA: Diagnosis not present

## 2017-04-21 DIAGNOSIS — R51 Headache: Secondary | ICD-10-CM | POA: Diagnosis not present

## 2017-04-23 DIAGNOSIS — Z09 Encounter for follow-up examination after completed treatment for conditions other than malignant neoplasm: Secondary | ICD-10-CM | POA: Diagnosis not present

## 2017-04-23 DIAGNOSIS — E669 Obesity, unspecified: Secondary | ICD-10-CM | POA: Diagnosis not present

## 2017-04-23 DIAGNOSIS — R51 Headache: Secondary | ICD-10-CM | POA: Diagnosis not present

## 2017-04-23 DIAGNOSIS — S065X9A Traumatic subdural hemorrhage with loss of consciousness of unspecified duration, initial encounter: Secondary | ICD-10-CM | POA: Diagnosis not present

## 2017-04-29 DIAGNOSIS — Z8659 Personal history of other mental and behavioral disorders: Secondary | ICD-10-CM | POA: Diagnosis not present

## 2017-04-29 DIAGNOSIS — F17210 Nicotine dependence, cigarettes, uncomplicated: Secondary | ICD-10-CM | POA: Diagnosis not present

## 2017-04-29 DIAGNOSIS — Z79899 Other long term (current) drug therapy: Secondary | ICD-10-CM | POA: Diagnosis not present

## 2017-04-29 DIAGNOSIS — E119 Type 2 diabetes mellitus without complications: Secondary | ICD-10-CM | POA: Diagnosis not present

## 2017-04-29 DIAGNOSIS — G8929 Other chronic pain: Secondary | ICD-10-CM | POA: Diagnosis not present

## 2017-04-29 DIAGNOSIS — M549 Dorsalgia, unspecified: Secondary | ICD-10-CM | POA: Diagnosis not present

## 2017-04-29 DIAGNOSIS — J449 Chronic obstructive pulmonary disease, unspecified: Secondary | ICD-10-CM | POA: Diagnosis not present

## 2017-04-29 DIAGNOSIS — M069 Rheumatoid arthritis, unspecified: Secondary | ICD-10-CM | POA: Diagnosis not present

## 2017-04-29 DIAGNOSIS — Z751 Person awaiting admission to adequate facility elsewhere: Secondary | ICD-10-CM | POA: Diagnosis not present

## 2017-04-29 DIAGNOSIS — I10 Essential (primary) hypertension: Secondary | ICD-10-CM | POA: Diagnosis not present

## 2017-04-29 DIAGNOSIS — R45851 Suicidal ideations: Secondary | ICD-10-CM | POA: Diagnosis not present

## 2017-04-29 DIAGNOSIS — R45850 Homicidal ideations: Secondary | ICD-10-CM | POA: Diagnosis not present

## 2017-04-29 DIAGNOSIS — Z794 Long term (current) use of insulin: Secondary | ICD-10-CM | POA: Diagnosis not present

## 2017-04-30 ENCOUNTER — Inpatient Hospital Stay (HOSPITAL_COMMUNITY)
Admission: AD | Admit: 2017-04-30 | Discharge: 2017-05-07 | DRG: 885 | Disposition: A | Discharge status: Home / Self Care | Payer: Medicare Other | Source: Intra-hospital | Attending: Psychiatry | Admitting: Psychiatry

## 2017-04-30 DIAGNOSIS — R45851 Suicidal ideations: Secondary | ICD-10-CM | POA: Diagnosis present

## 2017-04-30 DIAGNOSIS — K219 Gastro-esophageal reflux disease without esophagitis: Secondary | ICD-10-CM | POA: Diagnosis not present

## 2017-04-30 DIAGNOSIS — F329 Major depressive disorder, single episode, unspecified: Secondary | ICD-10-CM | POA: Diagnosis present

## 2017-04-30 DIAGNOSIS — E669 Obesity, unspecified: Secondary | ICD-10-CM | POA: Diagnosis present

## 2017-04-30 DIAGNOSIS — E119 Type 2 diabetes mellitus without complications: Secondary | ICD-10-CM | POA: Diagnosis not present

## 2017-04-30 DIAGNOSIS — M549 Dorsalgia, unspecified: Secondary | ICD-10-CM | POA: Diagnosis present

## 2017-04-30 DIAGNOSIS — G8929 Other chronic pain: Secondary | ICD-10-CM | POA: Diagnosis present

## 2017-04-30 DIAGNOSIS — I10 Essential (primary) hypertension: Secondary | ICD-10-CM | POA: Diagnosis present

## 2017-04-30 DIAGNOSIS — F419 Anxiety disorder, unspecified: Secondary | ICD-10-CM | POA: Diagnosis not present

## 2017-04-30 DIAGNOSIS — J449 Chronic obstructive pulmonary disease, unspecified: Secondary | ICD-10-CM | POA: Diagnosis present

## 2017-04-30 DIAGNOSIS — E1165 Type 2 diabetes mellitus with hyperglycemia: Secondary | ICD-10-CM | POA: Diagnosis present

## 2017-04-30 DIAGNOSIS — M1990 Unspecified osteoarthritis, unspecified site: Secondary | ICD-10-CM | POA: Diagnosis not present

## 2017-04-30 DIAGNOSIS — R45850 Homicidal ideations: Secondary | ICD-10-CM | POA: Diagnosis not present

## 2017-04-30 DIAGNOSIS — R440 Auditory hallucinations: Secondary | ICD-10-CM | POA: Diagnosis not present

## 2017-04-30 DIAGNOSIS — Z751 Person awaiting admission to adequate facility elsewhere: Secondary | ICD-10-CM | POA: Diagnosis not present

## 2017-04-30 DIAGNOSIS — Z8673 Personal history of transient ischemic attack (TIA), and cerebral infarction without residual deficits: Secondary | ICD-10-CM

## 2017-04-30 DIAGNOSIS — F17210 Nicotine dependence, cigarettes, uncomplicated: Secondary | ICD-10-CM | POA: Diagnosis not present

## 2017-04-30 DIAGNOSIS — Z8659 Personal history of other mental and behavioral disorders: Secondary | ICD-10-CM | POA: Diagnosis not present

## 2017-04-30 DIAGNOSIS — G4700 Insomnia, unspecified: Secondary | ICD-10-CM | POA: Diagnosis present

## 2017-04-30 DIAGNOSIS — Z6832 Body mass index (BMI) 32.0-32.9, adult: Secondary | ICD-10-CM

## 2017-04-30 DIAGNOSIS — F259 Schizoaffective disorder, unspecified: Principal | ICD-10-CM | POA: Diagnosis present

## 2017-04-30 DIAGNOSIS — M2550 Pain in unspecified joint: Secondary | ICD-10-CM | POA: Diagnosis not present

## 2017-04-30 DIAGNOSIS — R51 Headache: Secondary | ICD-10-CM | POA: Diagnosis present

## 2017-04-30 DIAGNOSIS — F250 Schizoaffective disorder, bipolar type: Secondary | ICD-10-CM | POA: Diagnosis not present

## 2017-04-30 DIAGNOSIS — Z590 Homelessness: Secondary | ICD-10-CM | POA: Diagnosis not present

## 2017-04-30 DIAGNOSIS — R441 Visual hallucinations: Secondary | ICD-10-CM | POA: Diagnosis not present

## 2017-04-30 DIAGNOSIS — R443 Hallucinations, unspecified: Secondary | ICD-10-CM | POA: Diagnosis not present

## 2017-04-30 DIAGNOSIS — R450 Nervousness: Secondary | ICD-10-CM | POA: Diagnosis not present

## 2017-04-30 HISTORY — DX: Gastro-esophageal reflux disease without esophagitis: K21.9

## 2017-04-30 HISTORY — DX: Unspecified asthma, uncomplicated: J45.909

## 2017-04-30 HISTORY — DX: Chronic obstructive pulmonary disease, unspecified: J44.9

## 2017-04-30 HISTORY — DX: Cerebral infarction, unspecified: I63.9

## 2017-04-30 MED ORDER — TRAZODONE HCL 50 MG PO TABS
50.0000 mg | ORAL_TABLET | Freq: Every evening | ORAL | Status: DC | PRN
  Filled 2017-04-30: qty 1

## 2017-04-30 MED ORDER — PREGABALIN 75 MG PO CAPS
75.0000 mg | ORAL_CAPSULE | Freq: Two times a day (BID) | ORAL | Status: DC
  Administered 2017-05-01 – 2017-05-06 (×13): 75 mg via ORAL
  Filled 2017-04-30 (×13): qty 1

## 2017-04-30 MED ORDER — INSULIN ASPART 100 UNIT/ML ~~LOC~~ SOLN
0.0000 [IU] | Freq: Three times a day (TID) | SUBCUTANEOUS | Status: DC
  Administered 2017-05-01: 11 [IU] via SUBCUTANEOUS
  Administered 2017-05-02: 8 [IU] via SUBCUTANEOUS
  Administered 2017-05-02 – 2017-05-03 (×3): 11 [IU] via SUBCUTANEOUS
  Administered 2017-05-03: 8 [IU] via SUBCUTANEOUS
  Administered 2017-05-03: 15 [IU] via SUBCUTANEOUS
  Administered 2017-05-04: 11 [IU] via SUBCUTANEOUS
  Administered 2017-05-04: 15 [IU] via SUBCUTANEOUS
  Administered 2017-05-04: 8 [IU] via SUBCUTANEOUS
  Administered 2017-05-05: 15 [IU] via SUBCUTANEOUS
  Administered 2017-05-05: 8 [IU] via SUBCUTANEOUS
  Administered 2017-05-05: 5 [IU] via SUBCUTANEOUS
  Administered 2017-05-06 (×2): 8 [IU] via SUBCUTANEOUS
  Administered 2017-05-07: 15 [IU] via SUBCUTANEOUS

## 2017-04-30 MED ORDER — ALUM & MAG HYDROXIDE-SIMETH 200-200-20 MG/5ML PO SUSP: 30.0000 mL | ORAL | Status: DC | PRN

## 2017-04-30 MED ORDER — INSULIN ASPART 100 UNIT/ML ~~LOC~~ SOLN
0.0000 [IU] | Freq: Every day | SUBCUTANEOUS | Status: DC
  Administered 2017-05-02: 5 [IU] via SUBCUTANEOUS
  Administered 2017-05-04: 3 [IU] via SUBCUTANEOUS
  Administered 2017-05-05: 5 [IU] via SUBCUTANEOUS
  Administered 2017-05-06: 4 [IU] via SUBCUTANEOUS

## 2017-04-30 MED ORDER — ACETAMINOPHEN 325 MG PO TABS
650.0000 mg | ORAL_TABLET | Freq: Four times a day (QID) | ORAL | Status: DC | PRN
Start: 2017-04-30 — End: 2017-05-08

## 2017-04-30 MED ORDER — HYDROXYZINE HCL 25 MG PO TABS
25.0000 mg | ORAL_TABLET | Freq: Four times a day (QID) | ORAL | Status: DC | PRN
  Administered 2017-05-02 – 2017-05-05 (×5): 25 mg via ORAL
  Filled 2017-04-30 (×6): qty 1

## 2017-04-30 MED ORDER — METOPROLOL SUCCINATE ER 50 MG PO TB24
50.0000 mg | ORAL_TABLET | Freq: Every day | ORAL | Status: DC
  Administered 2017-05-01 – 2017-05-06 (×6): 50 mg via ORAL
  Filled 2017-04-30 (×11): qty 1

## 2017-04-30 MED ORDER — MAGNESIUM HYDROXIDE 400 MG/5ML PO SUSP: 30.0000 mL | Freq: Every day | ORAL | Status: DC | PRN

## 2017-04-30 NOTE — Progress Notes (Signed)
Patient ID: Stephen Mann, male   DOB: 21-Sep-1960, 56 y.o.   MRN: [id number] Per State regulations 482.30 this chart was reviewed for medical necessity with respect to the patient's admission/duration of stay.    Next review date: 05/02/17  Thurman Coyer, BSN, RN-BC  Case Manager
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
## 2017-05-01 ENCOUNTER — Encounter (HOSPITAL_COMMUNITY): Payer: Self-pay

## 2017-05-01 ENCOUNTER — Other Ambulatory Visit: Payer: Self-pay

## 2017-05-01 DIAGNOSIS — R441 Visual hallucinations: Secondary | ICD-10-CM

## 2017-05-01 DIAGNOSIS — F17210 Nicotine dependence, cigarettes, uncomplicated: Secondary | ICD-10-CM

## 2017-05-01 DIAGNOSIS — F419 Anxiety disorder, unspecified: Secondary | ICD-10-CM

## 2017-05-01 DIAGNOSIS — M549 Dorsalgia, unspecified: Secondary | ICD-10-CM

## 2017-05-01 DIAGNOSIS — R45850 Homicidal ideations: Secondary | ICD-10-CM

## 2017-05-01 DIAGNOSIS — F259 Schizoaffective disorder, unspecified: Secondary | ICD-10-CM

## 2017-05-01 DIAGNOSIS — R440 Auditory hallucinations: Secondary | ICD-10-CM

## 2017-05-01 DIAGNOSIS — M2550 Pain in unspecified joint: Secondary | ICD-10-CM

## 2017-05-01 LAB — GLUCOSE, CAPILLARY
GLUCOSE-CAPILLARY: 350 mg/dL — AB (ref 65–99)
Glucose-Capillary: 253 mg/dL — ABNORMAL HIGH (ref 65–99)
Glucose-Capillary: 315 mg/dL — ABNORMAL HIGH (ref 65–99)

## 2017-05-01 MED ORDER — METHOCARBAMOL 500 MG PO TABS
500.0000 mg | ORAL_TABLET | Freq: Three times a day (TID) | ORAL | Status: DC | PRN
  Administered 2017-05-01 – 2017-05-06 (×6): 500 mg via ORAL
  Filled 2017-05-01 (×6): qty 1

## 2017-05-01 MED ORDER — DIVALPROEX SODIUM ER 250 MG PO TB24
750.0000 mg | ORAL_TABLET | Freq: Every day | ORAL | Status: DC
  Administered 2017-05-01 – 2017-05-04 (×4): 750 mg via ORAL
  Filled 2017-05-01 (×6): qty 3

## 2017-05-01 MED ORDER — PANTOPRAZOLE SODIUM 40 MG PO TBEC
40.0000 mg | DELAYED_RELEASE_TABLET | Freq: Every day | ORAL | Status: DC
  Administered 2017-05-01 – 2017-05-06 (×6): 40 mg via ORAL
  Filled 2017-05-01 (×9): qty 1

## 2017-05-01 MED ORDER — PALIPERIDONE ER 6 MG PO TB24
6.0000 mg | ORAL_TABLET | Freq: Every day | ORAL | Status: DC
  Administered 2017-05-02 – 2017-05-06 (×5): 6 mg via ORAL
  Filled 2017-05-01 (×7): qty 1

## 2017-05-01 MED ORDER — OXYCODONE-ACETAMINOPHEN 5-325 MG PO TABS
1.0000 | ORAL_TABLET | Freq: Once | ORAL | Status: AC
Start: 2017-05-01 — End: 2017-05-01
  Administered 2017-05-01: 1 via ORAL
  Filled 2017-05-01: qty 1

## 2017-05-01 MED ORDER — TRAZODONE HCL 100 MG PO TABS
100.0000 mg | ORAL_TABLET | Freq: Every evening | ORAL | Status: DC | PRN
  Administered 2017-05-01 – 2017-05-04 (×6): 100 mg via ORAL
  Filled 2017-05-01 (×10): qty 1

## 2017-05-01 NOTE — Progress Notes (Signed)
Adult Psychoeducational Group Note  Date:  05/01/2017 Time:  8:37 PM  Group Topic/Focus:  Wrap-Up Group:   The focus of this group is to help patients review their daily goal of treatment and discuss progress on daily workbooks.  Participation Level:  Active  Participation Quality:  Appropriate  Affect:  Appropriate  Cognitive:  Appropriate  Insight: Appropriate  Engagement in Group:  Engaged  Modes of Intervention:  Discussion  Additional Comments:  The patient expressed that his pain level made his day a 2.The patient also said that he attended group.  Octavio Manns 05/01/2017, 8:37 PM
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
## 2017-05-01 NOTE — Progress Notes (Signed)
Recreation Therapy Notes  INPATIENT RECREATION THERAPY ASSESSMENT  Patient Details Name: Stephen Mann MRN: [id number] DOB: [birth year] Today's Date: 05/01/2017  Patient Stressors: Family, Other (Comment)(Living situation)  Pt stated his daughter was a stressor. Pt stated he was here for S/I and H/I.  Coping Skills:   Isolate, Arguments, Substance Abuse, Avoidance, Self-Injury, Music  Personal Challenges: Anger, Concentration, Decision-Making, Stress Management, Substance Abuse  Leisure Interests (2+):  Exercise - Walking  Awareness of Community Resources:  Yes  Community Resources:  Park  Current Use: Yes  Patient Strengths:  "Ain't none"  Patient Identified Areas of Improvement:  "I don't, everybody else needs to"  Current Recreation Participation:  Everyday  Patient Goal for Hospitalization:  "What I want, they won't help me with"  [place] of Residence:  [place] of Residence:  [place]  Current SI (including self-harm):  No  Current HI:  Yes(Pt rated a 6 out of 10; contracts)  Consent to Intern Participation: N/A   Caroll Rancher, LRT/CTRS  Lillia Abed, Sharice Harriss A 05/01/2017, 2:24 PM
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
## 2017-05-01 NOTE — Progress Notes (Signed)
Patient refused CBG this morning and refused scheduled insulin. Patient states, "you're not gonna stick me every couple hours". Patient irritable with writer this morning and demanded morning medicine. Patient provided with scheduled 0800 medications early. Patient states, "so where's my flexeril? They just took everything away? You know my body is used to a certain thing". Patient encouraged to speak to provider this morning. Patient verbalized understanding and went to breakfast.

## 2017-05-01 NOTE — Progress Notes (Signed)
Recreation Therapy Notes  Date: 05/01/17 Time: 1000 Location: 500 Hall Dayroom  Group Topic: Self-Esteem  Goal Area(s) Addresses:  Patient will successfully identify positive attributes about themselves.  Patient will successfully identify benefit of improved self-esteem.   Intervention: Scientist, clinical (histocompatibility and immunogenetics), markers  Activity: Lawyer.  Patients were to create an advertisement that describes and highlights the positive traits and qualities about themselves.  Education: Self-Esteem, Building control surveyor.   Education Outcome: Acknowledges education/In group clarification offered/Needs additional education  Clinical Observations/Feedback: Pt did not attend group.    Caroll Rancher, LRT/CTRS        Caroll Rancher A 05/01/2017 12:05 PM
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
## 2017-05-01 NOTE — Progress Notes (Signed)
New order received from [facility] to administer Oxycodone (see [place]) in agreement that pt accept scheduled 1700 dose of 11 units of Novolog for CBG of 350 mg/dl. Writer approached pt at nursing station, explained new order to him and he was in agreement, "I will rather take that shit to have the Oxycodone but I really don't want to". Insulin and Oxycodone administered by night shift RN in Clinical research associate presence (verified) as ordered. Safety maintained.
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
## 2017-05-01 NOTE — Tx Team (Signed)
Initial Treatment Plan 05/01/2017 1:09 AM Vilinda Blanks HUD:[id number]    PATIENT STRESSORS: Financial difficulties Health problems Legal issue Substance abuse   PATIENT STRENGTHS: Average or above average intelligence Capable of independent living Communication skills General fund of knowledge   PATIENT IDENTIFIED PROBLEMS: Depression  Suicidal/homicidal ideation  Alcohol abuse  "Get off of alcohol"  "Find a place to stay"             DISCHARGE CRITERIA:  Improved stabilization in mood, thinking, and/or behavior Verbal commitment to aftercare and medication compliance Withdrawal symptoms are absent or subacute and managed without 24-hour nursing intervention  PRELIMINARY DISCHARGE PLAN: Outpatient therapy Medication management  PATIENT/FAMILY INVOLVEMENT: This treatment plan has been presented to and reviewed with the patient, Stephen Mann.  The patient and family have been given the opportunity to ask questions and make suggestions.  Levin Bacon, RN 05/01/2017, 1:09 AM

## 2017-05-01 NOTE — [facility] Suicide Risk Assessment (Signed)
[facility] INPATIENT:  Family/Significant Other Suicide Prevention Education  Suicide Prevention Education:  Patient Refusal for Family/Significant Other Suicide Prevention Education: The patient Stephen Mann has refused to provide written consent for family/significant other to be provided Family/Significant Other Suicide Prevention Education during admission and/or prior to discharge.  Physician notified.  [facility] 05/01/2017, 4:01 PM

## 2017-05-02 LAB — GLUCOSE, CAPILLARY
GLUCOSE-CAPILLARY: 297 mg/dL — AB (ref 65–99)
GLUCOSE-CAPILLARY: 285 mg/dL — AB (ref 65–99)
GLUCOSE-CAPILLARY: 302 mg/dL — AB (ref 65–99)
GLUCOSE-CAPILLARY: 306 mg/dL — AB (ref 65–99)
GLUCOSE-CAPILLARY: 414 mg/dL — AB (ref 65–99)

## 2017-05-02 MED ORDER — HYDROCODONE-ACETAMINOPHEN 10-325 MG PO TABS
1.0000 | ORAL_TABLET | Freq: Once | ORAL | Status: AC
  Administered 2017-05-02: 1 via ORAL
  Filled 2017-05-02: qty 1

## 2017-05-02 NOTE — Progress Notes (Signed)
Recreation Therapy Notes  Date: 05/02/17 Time: 1045 Location: Gym  Group Topic: Wellness  Goal Area(s) Addresses:  Patient will define components of whole wellness. Patient will verbalize benefit of whole wellness.  Intervention: 4 rubber bases, rubber ball   Activity: Kickball.  Patients were divided into two teams.  Each team would go until they reached three outs.  The teams would then reverse fields and the other team would get a chance to go.  The team that finished with the most points, wins the game.  Education: Wellness, Building control surveyor.   Education Outcome: Acknowledges education/In group clarification offered/Needs additional education.   Clinical Observations/Feedback:  Pt did not attend group.    Caroll Rancher, LRT/CTRS         Caroll Rancher A 05/02/2017 12:04 PM

## 2017-05-02 NOTE — Progress Notes (Signed)
D.  Pt observed up in dayroom, complaint of head pain.  Pt states that he had a recent stroke and a bleed on his brain and the doctor told him his head will continue to hurt for "awhile".  Pt requested pain medication, states he was getting it before he came in and had a prescription.  Pt otherwise pleasant and cooperative.  Pt's blood sugar was 414 and this was reported to NP.  Pt was given Novolog 5 units coverage.  Pt denies SI/HI/AVH at this time but HI and AVH were reported on day shift.   HI was reportedly "anyone who breathes my air".  A.  Support and encouragement offered, NP contacted and order for [facility] 10/325 ordered x 1.  Pt encouraged to talk to doctor tomorrow about pain medication.  Nighttime medications given as ordered  R.  Pt remains safe on the unit, will continue to monitor.

## 2017-05-02 NOTE — Progress Notes (Signed)
[facility] MD Progress Note  05/02/2017 11:39 AM Stephen Mann  MRN:  [id number] Subjective:   Stephen Mann is a 56 y/o M with history of schizoaffective disorder who was admitted on IVC as a transfer from [facility] with worsening depression, SI, HI, and hallucinations. He was started on combination of depakote and invega, which he has been tolerating without difficulty or side effects. As per RN report, pt had initially refused insulin last evening but with encouragement pt was accepting of his prescribed medications. Today upon evaluation, pt reports he is feeling "a little better." He denies SI. He reports HI not directed at a specific individual, and he comments, "That's pretty much an every day thing." Pt is able to contract for safety while in the hospital. He denies AH, and he states that they resolved yesterday. He reports improvement of VH to just having minimal "glimpses" of objects. Pt is sleeping well and his appetite is good. He is feeling that his hospitalization has been helping his mood, and he would like to remain in the hospital at this time as he does not feel safe to leave. Pt agrees to continue his current medication regimen, and we will plan to obtain a depakote level on Monday AM (05/05/17). Pt had no further questions, comments, or concerns.   Principal Problem: Schizoaffective disorder (HCC) Diagnosis:   Patient Active Problem List   Diagnosis Date Noted  . Schizoaffective disorder (HCC) [F25.9] 05/01/2017  . MDD (major depressive disorder) [F32.9] 04/30/2017   Total Time spent with patient: 30 minutes  Past Psychiatric History: see H&P  Past Medical History:  Past Medical History:  Diagnosis Date  . Anxiety   . Asthma   . COPD (chronic obstructive pulmonary disease) (HCC)   . Diabetes mellitus without complication (HCC)   . GERD (gastroesophageal reflux disease)   . Hypertension   . Stroke [facility])    History reviewed. No pertinent surgical history. Family History:  History reviewed. No pertinent family history. Family Psychiatric  History: see H&P Social History:  Social History   Substance and Sexual Activity  Alcohol Use Yes   Comment: Unk      Social History   Substance and Sexual Activity  Drug Use No    Social History   Socioeconomic History  . Marital status: Single    Spouse name: None  . Number of children: None  . Years of education: None  . Highest education level: None  Social Needs  . Financial resource strain: None  . Food insecurity - worry: None  . Food insecurity - inability: None  . Transportation needs - medical: None  . Transportation needs - non-medical: None  Occupational History  . None  Tobacco Use  . Smoking status: Current Every Day Smoker    Types: Cigarettes  . Smokeless tobacco: Never Used  Substance and Sexual Activity  . Alcohol use: Yes    Comment: Unk   . Drug use: No  . Sexual activity: Yes    Birth control/protection: None  Other Topics Concern  . None  Social History Narrative  . None   Additional Social History:    Pain Medications: See MAR  Prescriptions: See MAR  Over the Counter: See MAR  History of alcohol / drug use?: Yes Longest period of sobriety (when/how long): None  Name of Substance 1: cocaine 1 - Frequency: ongoing Name of Substance 2: Amphetamines 2 - Frequency: ongoing Name of Substance 3: Alcohol 3 - Age of First Use: 6 3 -  Frequency: ongoing              Sleep: Fair  Appetite:  Fair  Current Medications: Current Facility-Administered Medications  Medication Dose Route Frequency Provider Last Rate Last Dose  . acetaminophen (TYLENOL) tablet 650 mg  650 mg Oral Q6H PRN Rankin, Shuvon B, NP      . alum & mag hydroxide-simeth (MAALOX/MYLANTA) 200-200-20 MG/5ML suspension 30 mL  30 mL Oral Q4H PRN Rankin, Shuvon B, NP      . divalproex (DEPAKOTE ER) 24 hr tablet 750 mg  750 mg Oral QHS Jolyne Loa T, MD   750 mg at 05/01/17 2115  . hydrOXYzine  (ATARAX/VISTARIL) tablet 25 mg  25 mg Oral Q6H PRN Donell Sievert E, PA-C      . insulin aspart (novoLOG) injection 0-15 Units  0-15 Units Subcutaneous TID WC Donell Sievert E, PA-C   8 Units at 05/02/17 0636  . insulin aspart (novoLOG) injection 0-5 Units  0-5 Units Subcutaneous QHS Kerry Hough, PA-C   Stopped at 05/01/17 2200  . magnesium hydroxide (MILK OF MAGNESIA) suspension 30 mL  30 mL Oral Daily PRN Rankin, Shuvon B, NP      . methocarbamol (ROBAXIN) tablet 500 mg  500 mg Oral Q8H PRN Armandina Stammer I, NP   500 mg at 05/01/17 1947  . metoprolol succinate (TOPROL-XL) 24 hr tablet 50 mg  50 mg Oral Daily Rankin, Shuvon B, NP   50 mg at 05/02/17 0758  . paliperidone (INVEGA) 24 hr tablet 6 mg  6 mg Oral Daily Micheal Likens, MD   6 mg at 05/02/17 0758  . pantoprazole (PROTONIX) EC tablet 40 mg  40 mg Oral Daily Armandina Stammer I, NP   40 mg at 05/02/17 0757  . pregabalin (LYRICA) capsule 75 mg  75 mg Oral BID Rankin, Shuvon B, NP   75 mg at 05/02/17 0759  . traZODone (DESYREL) tablet 100 mg  100 mg Oral QHS,MR X 1 Kerry Hough, PA-C   100 mg at 05/01/17 2115    Lab Results:  Results for orders placed or performed during the hospital encounter of 04/30/17 (from the past 48 hour(s))  Glucose, capillary     Status: Abnormal   Collection Time: 05/01/17 12:16 AM  Result Value Ref Range   Glucose-Capillary 253 (H) 65 - 99 mg/dL  Glucose, capillary     Status: Abnormal   Collection Time: 05/01/17  5:15 PM  Result Value Ref Range   Glucose-Capillary 350 (H) 65 - 99 mg/dL  Glucose, capillary     Status: Abnormal   Collection Time: 05/01/17  9:12 PM  Result Value Ref Range   Glucose-Capillary 315 (H) 65 - 99 mg/dL  Glucose, capillary     Status: Abnormal   Collection Time: 05/02/17  1:28 AM  Result Value Ref Range   Glucose-Capillary 297 (H) 65 - 99 mg/dL  Glucose, capillary     Status: Abnormal   Collection Time: 05/02/17  6:22 AM  Result Value Ref Range   Glucose-Capillary  285 (H) 65 - 99 mg/dL    Blood Alcohol level:  Lab Results  Component Value Date   ETH <11 12/18/2012   ETH <11 12/18/2012    Metabolic Disorder Labs: No results found for: HGBA1C, MPG No results found for: PROLACTIN No results found for: CHOL, TRIG, HDL, CHOLHDL, VLDL, LDLCALC  Physical Findings: AIMS: Facial and Oral Movements Muscles of Facial Expression: None, normal Lips and Perioral Area: None, normal Jaw:  None, normal Tongue: None, normal,Extremity Movements Upper (arms, wrists, hands, fingers): None, normal Lower (legs, knees, ankles, toes): None, normal, Trunk Movements Neck, shoulders, hips: None, normal, Overall Severity Severity of abnormal movements (highest score from questions above): None, normal Incapacitation due to abnormal movements: None, normal Patient's awareness of abnormal movements (rate only patient's report): No Awareness, Dental Status Current problems with teeth and/or dentures?: Yes Does patient usually wear dentures?: No  CIWA:    COWS:     Musculoskeletal: Strength & Muscle Tone: within normal limits Gait & Station: normal Patient leans: N/A  Psychiatric Specialty Exam: Physical Exam  Nursing note and vitals reviewed.   Review of Systems  Constitutional: Negative for chills and fever.  Respiratory: Negative for cough.   Cardiovascular: Negative for chest pain.  Gastrointestinal: Negative for heartburn and nausea.  Psychiatric/Behavioral: Positive for hallucinations. Negative for depression and suicidal ideas. The patient is nervous/anxious.     Blood pressure (!) 131/55, pulse 72, temperature 98.7 F (37.1 C), temperature source Oral, resp. rate 16, height 5' 7.32" (1.71 m), weight 94.3 kg (208 lb).Body mass index is 32.27 kg/m.  General Appearance: Casual and Disheveled  Eye Contact:  Good  Speech:  Clear and Coherent and Normal Rate  Volume:  Normal  Mood:  Dysphoric and Irritable  Affect:  Congruent and Constricted  Thought  Process:  Coherent and Goal Directed  Orientation:  Full (Time, Place, and Person)  Thought Content:  Hallucinations: Visual  Suicidal Thoughts:  No  Homicidal Thoughts:  Yes.  without intent/plan  Memory:  Immediate;   Good Recent;   Good Remote;   Good  Judgement:  Fair  Insight:  Fair  Psychomotor Activity:  Normal  Concentration:  Concentration: Good  Recall:  Good  Fund of Knowledge:  Good  Language:  Good  Akathisia:  No  Handed:    AIMS (if indicated):     Assets:  Communication Skills Resilience Social Support  ADL's:  Intact  Cognition:  WNL  Sleep:  Number of Hours: 4.75     Treatment Plan Summary: Daily contact with patient to assess and evaluate symptoms and progress in treatment and Medication management. Pt continues to have some VH but he reports improvement of AH and his mood symptoms with starting new regimen of invega and depakote. We will anticipate drawing depakote level of 12/10.   - Continue inpatient hospitalization  - Labs: depakote level on 05/05/17 AM  - Schizoaffective disorder             - Continue Invega 6mg  qDay             - Continue Depakote ER 750mg  qhs - Anxiety              - Continue atarax 25mg  q8h prn anxiety - Insomnia             - Continue trazodone 100mg  qhs prn insomnia - DMII             - Continue SSI - HTN             - Continue toprol-XL - Chronic pain             - Continue Lyrica - GERD    - Continue protonix  - Encourage participation in groups and therapeutic milieu -Discharge planning will be ongoing    , MD 05/02/2017, 11:39 AM

## 2017-05-02 NOTE — Tx Team (Signed)
Interdisciplinary Treatment and Diagnostic Plan Update  05/02/2017 Time of Session: 8:14 AM  Jacobb Alen MRN: [id number]  Principal Diagnosis: Schizoaffective disorder [facility])  Secondary Diagnoses: Principal Problem:   Schizoaffective disorder ([place]) Active Problems:   MDD (major depressive disorder)   Current Medications:  Current Facility-Administered Medications  Medication Dose Route Frequency Provider Last Rate Last Dose  . acetaminophen (TYLENOL) tablet 650 mg  650 mg Oral Q6H PRN Rankin, Shuvon B, NP      . alum & mag hydroxide-simeth (MAALOX/MYLANTA) 200-200-20 MG/5ML suspension 30 mL  30 mL Oral Q4H PRN Rankin, Shuvon B, NP      . divalproex (DEPAKOTE ER) 24 hr tablet 750 mg  750 mg Oral QHS Maris Berger T, MD   750 mg at 05/01/17 2115  . hydrOXYzine (ATARAX/VISTARIL) tablet 25 mg  25 mg Oral Q6H PRN Patriciaann Clan E, PA-C      . insulin aspart (novoLOG) injection 0-15 Units  0-15 Units Subcutaneous TID WC Patriciaann Clan E, PA-C   8 Units at 05/02/17 0636  . insulin aspart (novoLOG) injection 0-5 Units  0-5 Units Subcutaneous QHS Laverle Hobby, PA-C   Stopped at 05/01/17 2200  . magnesium hydroxide (MILK OF MAGNESIA) suspension 30 mL  30 mL Oral Daily PRN Rankin, Shuvon B, NP      . methocarbamol (ROBAXIN) tablet 500 mg  500 mg Oral Q8H PRN Lindell Spar I, NP   500 mg at 05/01/17 1947  . metoprolol succinate (TOPROL-XL) 24 hr tablet 50 mg  50 mg Oral Daily Rankin, Shuvon B, NP   50 mg at 05/02/17 0758  . paliperidone (INVEGA) 24 hr tablet 6 mg  6 mg Oral Daily Pennelope Bracken, MD   6 mg at 05/02/17 0758  . pantoprazole (PROTONIX) EC tablet 40 mg  40 mg Oral Daily Lindell Spar I, NP   40 mg at 05/02/17 0757  . pregabalin (LYRICA) capsule 75 mg  75 mg Oral BID Rankin, Shuvon B, NP   75 mg at 05/02/17 0759  . traZODone (DESYREL) tablet 100 mg  100 mg Oral QHS,MR X 1 Laverle Hobby, PA-C   100 mg at 05/01/17 2115    PTA Medications: Medications Prior to  Admission  Medication Sig Dispense Refill Last Dose  . ALPRAZolam (XANAX) 1 MG tablet Take 1 mg by mouth 2 (two) times daily as needed for anxiety.      Marland Kitchen dexlansoprazole (DEXILANT) 60 MG capsule Take 60 mg by mouth daily.     Marland Kitchen HYDROcodone-acetaminophen (NORCO) 10-325 MG per tablet Take 1 tablet by mouth 2 (two) times daily.     . metoprolol succinate (TOPROL-XL) 50 MG 24 hr tablet Take 50 mg by mouth daily. Take with or immediately following a meal.     . pregabalin (LYRICA) 75 MG capsule Take 75 mg by mouth 2 (two) times daily.       Patient Stressors: Financial difficulties Health problems Legal issue Substance abuse  Patient Strengths: Average or above average intelligence Capable of independent living Communication skills General fund of knowledge  Treatment Modalities: Medication Management, Group therapy, Case management,  1 to 1 session with clinician, Psychoeducation, Recreational therapy.   Physician Treatment Plan for Primary Diagnosis: Schizoaffective disorder ([place]) Long Term Goal(s): Improvement in symptoms so as ready for discharge  Short Term Goals: Ability to identify changes in lifestyle to reduce recurrence of condition will improve Ability to verbalize feelings will improve Ability to disclose and discuss suicidal ideas Ability to demonstrate  self-control will improve Ability to identify and develop effective coping behaviors will improve Compliance with prescribed medications will improve Ability to identify triggers associated with substance abuse/mental health issues will improve  Medication Management: Evaluate patient's response, side effects, and tolerance of medication regimen.  Therapeutic Interventions: 1 to 1 sessions, Unit Group sessions and Medication administration.  Evaluation of Outcomes: Progressing  Physician Treatment Plan for Secondary Diagnosis: Principal Problem:   Schizoaffective disorder ([place]) Active Problems:   MDD (major  depressive disorder)   Long Term Goal(s): Improvement in symptoms so as ready for discharge  Short Term Goals: Ability to identify changes in lifestyle to reduce recurrence of condition will improve Ability to verbalize feelings will improve Ability to disclose and discuss suicidal ideas Ability to demonstrate self-control will improve Ability to identify and develop effective coping behaviors will improve Compliance with prescribed medications will improve Ability to identify triggers associated with substance abuse/mental health issues will improve  Medication Management: Evaluate patient's response, side effects, and tolerance of medication regimen.  Therapeutic Interventions: 1 to 1 sessions, Unit Group sessions and Medication administration.  Evaluation of Outcomes: Progressing   RN Treatment Plan for Primary Diagnosis: Schizoaffective disorder ([place]) Long Term Goal(s): Knowledge of disease and therapeutic regimen to maintain health will improve  Short Term Goals: Ability to identify and develop effective coping behaviors will improve and Compliance with prescribed medications will improve  Medication Management: RN will administer medications as ordered by provider, will assess and evaluate patient's response and provide education to patient for prescribed medication. RN will report any adverse and/or side effects to prescribing provider.  Therapeutic Interventions: 1 on 1 counseling sessions, Psychoeducation, Medication administration, Evaluate responses to treatment, Monitor vital signs and CBGs as ordered, Perform/monitor CIWA, COWS, AIMS and Fall Risk screenings as ordered, Perform wound care treatments as ordered.  Evaluation of Outcomes: Progressing   LCSW Treatment Plan for Primary Diagnosis: Schizoaffective disorder ([place]) Long Term Goal(s): Safe transition to appropriate next level of care at discharge, Engage patient in therapeutic group addressing interpersonal  concerns.  Short Term Goals: Engage patient in aftercare planning with referrals and resources  Therapeutic Interventions: Assess for all discharge needs, 1 to 1 time with Social worker, Explore available resources and support systems, Assess for adequacy in community support network, Educate family and significant other(s) on suicide prevention, Complete Psychosocial Assessment, Interpersonal group therapy.  Evaluation of Outcomes: Met  Return home, follow up [facility]   Progress in Treatment: Attending groups: Yes Participating in groups: Yes Taking medication as prescribed: Yes Toleration medication: Yes, no side effects reported at this time Family/Significant other contact made: No Patient understands diagnosis: Yes AEB asking for help with mood stabilization Discussing patient identified problems/goals with staff: Yes Medical problems stabilized or resolved: Yes Denies suicidal/homicidal ideation: Yes Issues/concerns per patient self-inventory: None Other: N/A  New problem(s) identified: None identified at this time.   New Short Term/Long Term Goal(s): "Get off of alcohol"   Discharge Plan or Barriers:   Reason for Continuation of Hospitalization:  Depression Hallucinations  Medication stabilization   Estimated Length of Stay: 12/12  Attendees: Patient:  Norma Ignasiak 05/02/2017  8:14 AM  Physician: Maris Berger, MD 05/02/2017  8:14 AM  Nursing: Sena Hitch, RN 05/02/2017  8:14 AM  RN Care Manager: Lars Pinks, RN 05/02/2017  8:14 AM  Social Worker: Ripley Fraise 05/02/2017  8:14 AM  Recreational Therapist: Winfield Cunas 05/02/2017  8:14 AM  Other: Norberto Sorenson 05/02/2017  8:14 AM  Other:  05/02/2017  8:14 AM    Scribe for Treatment Team:  Roque Lias LCSW 05/02/2017 8:14 AM

## 2017-05-02 NOTE — Progress Notes (Signed)
Patient ID: Stephen Mann, male   DOB: 1961/04/12, 56 y.o.   MRN: [id number]  DAR Note: Pt with h/o of diabetes continue to be noncompliant with diet restriction; "I eat anything I want to eat; with the insulin I get at home, I can eat anything I want." Pt continue to endorse moderate anxiety, depression and chronic head pain-see MAR. Pt however, denied SI, HI or AVH. Support, encouragement, and safe environment provided.  15-minute safety checks continue. All Pt's questions and concerns addressed. Pt was med compliant. Pt did attend wrap-up group.

## 2017-05-02 NOTE — Progress Notes (Signed)
Inpatient Diabetes Program Recommendations  AACE/ADA: New Consensus Statement on Inpatient Glycemic Control (2015)  Target Ranges:  Prepandial:   less than 140 mg/dL      Peak postprandial:   less than 180 mg/dL (1-2 hours)      Critically ill patients:  140 - 180 mg/dL   Lab Results  Component Value Date   GLUCAP 285 (H) 05/02/2017    Review of Glycemic Control  Diabetes history: DM 2 Outpatient Diabetes medications: None Current orders for Inpatient glycemic control: Novolog Moderate Correction 0-15 units tid + Novolog HS scale 0-5 units  Inpatient Diabetes Program Recommendations:    Glucose trend in the upper 200 - 300 range. Please consider low dose basal insulin, Lantus 15 units Q24 hours while here.  Thanks,  Christena Deem RN, MSN, [facility] Inpatient Diabetes Coordinator Team Pager [phone] (8a-5p)

## 2017-05-03 DIAGNOSIS — R443 Hallucinations, unspecified: Secondary | ICD-10-CM

## 2017-05-03 DIAGNOSIS — I10 Essential (primary) hypertension: Secondary | ICD-10-CM

## 2017-05-03 DIAGNOSIS — F250 Schizoaffective disorder, bipolar type: Secondary | ICD-10-CM

## 2017-05-03 DIAGNOSIS — E119 Type 2 diabetes mellitus without complications: Secondary | ICD-10-CM

## 2017-05-03 DIAGNOSIS — K219 Gastro-esophageal reflux disease without esophagitis: Secondary | ICD-10-CM

## 2017-05-03 DIAGNOSIS — R450 Nervousness: Secondary | ICD-10-CM

## 2017-05-03 DIAGNOSIS — G4700 Insomnia, unspecified: Secondary | ICD-10-CM

## 2017-05-03 DIAGNOSIS — G8929 Other chronic pain: Secondary | ICD-10-CM

## 2017-05-03 LAB — GLUCOSE, CAPILLARY
Glucose-Capillary: 290 mg/dL — ABNORMAL HIGH (ref 65–99)
Glucose-Capillary: 331 mg/dL — ABNORMAL HIGH (ref 65–99)
Glucose-Capillary: 361 mg/dL — ABNORMAL HIGH (ref 65–99)
Glucose-Capillary: 323 mg/dL — ABNORMAL HIGH (ref 65–99)

## 2017-05-03 NOTE — [facility] Group Notes (Signed)
[facility] Group Notes: (Clinical Social Work)   05/03/2017      Type of Therapy:  Group Therapy   Participation Level:  Did Not Attend despite MHT prompting   Ambrose Mantle, LCSW 05/03/2017, 12:31 PM

## 2017-05-03 NOTE — Progress Notes (Signed)
D. Pt observed in milieu upon initial approach. Pt is calm, cooperative- no behavioral issues- compliant with meds. Pt reports sleeping poorly last night, endorsing low energy level. Per pt's self inventory pt rates his depression, hopelessness and anxiety a 7/8/8, respectively. Pt endorses generalized pain, but refuses prn pain med at this time. Pt states that his goal today is to "quit isolating myself". Pt currently denies SI/HI and  agrees to contact staff before acting on any harmful thoughts.  A. Labs and vitals monitored. Pt compliant with scheduled medications. Pt supported emotionally and encouraged to express concerns and ask questions.   R. Pt remains safe with 15 minute checks. Will continue POC.

## 2017-05-03 NOTE — Progress Notes (Signed)
[facility] MD Progress Note  05/03/2017 2:26 PM Stephen Mann  MRN:  [id number]  Subjective: Stephen Mann reports, "I'm not good today. I can't get rid of this headache pain. Only Oxycodone helps it. I'm okay, I guess I can says that my mood is okay (medium). I hear voices everyday & I think about hurting people all the time. I cannot help it because human beings are awful. I don't want to be around them. I feel like they are taking up my space"  Objective: Stephen Mann is a 56 y/o M with history of schizoaffective disorder who was admitted on IVC as a transfer from [facility] with worsening depression, SI, HI, and hallucinations. He was started on combination of depakote and invega, which he has been tolerating without difficulty or side effects. As per RN report, pt had initially refused insulin 2 evenings ago but with encouragement pt was accepting of his prescribed medications. Today, 05-03-17, upon evaluation, pt reports he is feeling "a little better." He denies SI. He reports HI not directed at a specific individual, and he comments, "That's pretty much an every day thing." Pt is able to contract for safety while in the hospital. He admits AH, and he states that they are an everyday thing for him. He reports improvement of VH to just having minimal "glimpses" of objects. Pt is sleeping well and his appetite is good. He is feeling that his hospitalization has been helping his mood, and he would like to remain in the hospital at this time as he does not feel safe to leave. Pt agrees to continue his current medication regimen, and we will plan to obtain a depakote level on Monday AM (05/05/17). Pt had no further questions, comments, or concerns. He continues to endorse headache pain, says only Oxycodone tablethelps.  Principal Problem: Schizoaffective disorder (HCC) Diagnosis:   Patient Active Problem List   Diagnosis Date Noted  . Schizoaffective disorder (HCC) [F25.9] 05/01/2017  . MDD (major depressive  disorder) [F32.9] 04/30/2017   Total Time spent with patient: 15 minutes  Past Psychiatric History: See H&P  Past Medical History:  Past Medical History:  Diagnosis Date  . Anxiety   . Asthma   . COPD (chronic obstructive pulmonary disease) (HCC)   . Diabetes mellitus without complication (HCC)   . GERD (gastroesophageal reflux disease)   . Hypertension   . Stroke [facility])    History reviewed. No pertinent surgical history. Family History: History reviewed. No pertinent family history.  Family Psychiatric  History: See H&P  Social History:  Social History   Substance and Sexual Activity  Alcohol Use Yes   Comment: Unk      Social History   Substance and Sexual Activity  Drug Use No    Social History   Socioeconomic History  . Marital status: Single    Spouse name: None  . Number of children: None  . Years of education: None  . Highest education level: None  Social Needs  . Financial resource strain: None  . Food insecurity - worry: None  . Food insecurity - inability: None  . Transportation needs - medical: None  . Transportation needs - non-medical: None  Occupational History  . None  Tobacco Use  . Smoking status: Current Every Day Smoker    Types: Cigarettes  . Smokeless tobacco: Never Used  Substance and Sexual Activity  . Alcohol use: Yes    Comment: Unk   . Drug use: No  . Sexual activity: Yes  Birth control/protection: None  Other Topics Concern  . None  Social History Narrative  . None   Additional Social History:    Pain Medications: See MAR  Prescriptions: See MAR  Over the Counter: See MAR  History of alcohol / drug use?: Yes Longest period of sobriety (when/how long): None  Name of Substance 1: cocaine 1 - Frequency: ongoing Name of Substance 2: Amphetamines 2 - Frequency: ongoing Name of Substance 3: Alcohol 3 - Age of First Use: 6 3 - Frequency: ongoing  Sleep: Fair  Appetite:  Fair  Current Medications: Current  Facility-Administered Medications  Medication Dose Route Frequency Provider Last Rate Last Dose  . acetaminophen (TYLENOL) tablet 650 mg  650 mg Oral Q6H PRN Rankin, Shuvon B, NP      . alum & mag hydroxide-simeth (MAALOX/MYLANTA) 200-200-20 MG/5ML suspension 30 mL  30 mL Oral Q4H PRN Rankin, Shuvon B, NP      . divalproex (DEPAKOTE ER) 24 hr tablet 750 mg  750 mg Oral QHS Jolyne Loa T, MD   750 mg at 05/02/17 2113  . hydrOXYzine (ATARAX/VISTARIL) tablet 25 mg  25 mg Oral Q6H PRN Kerry Hough, PA-C   25 mg at 05/02/17 2113  . insulin aspart (novoLOG) injection 0-15 Units  0-15 Units Subcutaneous TID WC Kerry Hough, PA-C   11 Units at 05/03/17 1214  . insulin aspart (novoLOG) injection 0-5 Units  0-5 Units Subcutaneous QHS Kerry Hough, PA-C   5 Units at 05/02/17 2114  . magnesium hydroxide (MILK OF MAGNESIA) suspension 30 mL  30 mL Oral Daily PRN Rankin, Shuvon B, NP      . methocarbamol (ROBAXIN) tablet 500 mg  500 mg Oral Q8H PRN Armandina Stammer I, NP   500 mg at 05/02/17 1715  . metoprolol succinate (TOPROL-XL) 24 hr tablet 50 mg  50 mg Oral Daily Rankin, Shuvon B, NP   50 mg at 05/03/17 0813  . paliperidone (INVEGA) 24 hr tablet 6 mg  6 mg Oral Daily Micheal Likens, MD   6 mg at 05/03/17 0813  . pantoprazole (PROTONIX) EC tablet 40 mg  40 mg Oral Daily Armandina Stammer I, NP   40 mg at 05/03/17 0814  . pregabalin (LYRICA) capsule 75 mg  75 mg Oral BID Rankin, Shuvon B, NP   75 mg at 05/03/17 0814  . traZODone (DESYREL) tablet 100 mg  100 mg Oral QHS,MR X 1 Kerry Hough, PA-C   100 mg at 05/02/17 2214    Lab Results:  Results for orders placed or performed during the hospital encounter of 04/30/17 (from the past 48 hour(s))  Glucose, capillary     Status: Abnormal   Collection Time: 05/01/17  5:15 PM  Result Value Ref Range   Glucose-Capillary 350 (H) 65 - 99 mg/dL  Glucose, capillary     Status: Abnormal   Collection Time: 05/01/17  9:12 PM  Result Value  Ref Range   Glucose-Capillary 315 (H) 65 - 99 mg/dL  Glucose, capillary     Status: Abnormal   Collection Time: 05/02/17  1:28 AM  Result Value Ref Range   Glucose-Capillary 297 (H) 65 - 99 mg/dL  Glucose, capillary     Status: Abnormal   Collection Time: 05/02/17  6:22 AM  Result Value Ref Range   Glucose-Capillary 285 (H) 65 - 99 mg/dL  Glucose, capillary     Status: Abnormal   Collection Time: 05/02/17 12:03 PM  Result Value Ref Range  Glucose-Capillary 302 (H) 65 - 99 mg/dL  Glucose, capillary     Status: Abnormal   Collection Time: 05/02/17  5:06 PM  Result Value Ref Range   Glucose-Capillary 306 (H) 65 - 99 mg/dL   Comment 1 Notify RN    Comment 2 Document in Chart   Glucose, capillary     Status: Abnormal   Collection Time: 05/02/17  9:08 PM  Result Value Ref Range   Glucose-Capillary 414 (H) 65 - 99 mg/dL  Glucose, capillary     Status: Abnormal   Collection Time: 05/03/17  5:24 AM  Result Value Ref Range   Glucose-Capillary 290 (H) 65 - 99 mg/dL  Glucose, capillary     Status: Abnormal   Collection Time: 05/03/17 12:06 PM  Result Value Ref Range   Glucose-Capillary 331 (H) 65 - 99 mg/dL   Blood Alcohol level:  Lab Results  Component Value Date   ETH <11 12/18/2012   ETH <11 12/18/2012   Metabolic Disorder Labs: No results found for: HGBA1C, MPG No results found for: PROLACTIN No results found for: CHOL, TRIG, HDL, CHOLHDL, VLDL, LDLCALC  Physical Findings: AIMS: Facial and Oral Movements Muscles of Facial Expression: None, normal Lips and Perioral Area: None, normal Jaw: None, normal Tongue: None, normal,Extremity Movements Upper (arms, wrists, hands, fingers): None, normal Lower (legs, knees, ankles, toes): None, normal, Trunk Movements Neck, shoulders, hips: None, normal, Overall Severity Severity of abnormal movements (highest score from questions above): None, normal Incapacitation due to abnormal movements: None, normal Patient's awareness of  abnormal movements (rate only patient's report): No Awareness, Dental Status Current problems with teeth and/or dentures?: Yes Does patient usually wear dentures?: No  CIWA:    COWS:     Musculoskeletal: Strength & Muscle Tone: within normal limits Gait & Station: normal Patient leans: N/A  Psychiatric Specialty Exam: Physical Exam  Nursing note and vitals reviewed.   Review of Systems  Constitutional: Negative for chills and fever.  Respiratory: Negative for cough.   Cardiovascular: Negative for chest pain.  Gastrointestinal: Negative for heartburn and nausea.  Psychiatric/Behavioral: Positive for hallucinations. Negative for depression and suicidal ideas. The patient is nervous/anxious.     Blood pressure 125/73, pulse 79, temperature 98.2 F (36.8 C), temperature source Oral, resp. rate 18, height 5' 7.32" (1.71 m), weight 94.3 kg (208 lb).Body mass index is 32.27 kg/m.  General Appearance: Casual and Disheveled  Eye Contact:  Good  Speech:  Clear and Coherent and Normal Rate  Volume:  Normal  Mood:  Dysphoric and Irritable  Affect:  Congruent and Constricted, at times reactive,  Thought Process:  Coherent and Goal Directed  Orientation:  Full (Time, Place, and Person)  Thought Content:  Hallucinations: Visual, auditory  Suicidal Thoughts:  Denies  Homicidal Thoughts:  Yes.  without intent/plan, says, always on going.  Memory:  Immediate;   Good Recent;   Good Remote;   Good  Judgement:  Fair  Insight:  Fair  Psychomotor Activity:  Normal  Concentration:  Concentration: Good  Recall:  Good  Fund of Knowledge:  Good  Language:  Good  Akathisia:  No  Handed:    AIMS (if indicated):     Assets:  Communication Skills Resilience Social Support  ADL's:  Intact  Cognition:  WNL  Sleep:  Number of Hours: 0.5   Treatment Plan Summary: Daily contact with patient to assess and evaluate symptoms and progress in treatment and Medication management. Pt continues to have  some VH, which he  says has always been there, but reports improvement of AH and his mood symptoms. Started new regimen of invega and Depakote upon admission. We will anticipate drawing depakote level of 12/10.  - Continue inpatient hospitalization  - Labs: depakote level on 05/05/17 AM  Will continue today 05/03/2017 plan as below except where it is noted.  - Schizoaffective disorder             - Continue Invega 6mg  qDay             - Continue Depakote ER 750mg  qhs - Anxiety              - Continue atarax 25mg  q8h prn anxiety - Insomnia             - Continue trazodone 100mg  qhs prn insomnia - DMII             - Continue SSI - HTN             - Continue toprol-XL - Chronic pain             - Continue Lyrica - GERD    - Continue protonix  - Encourage participation in groups and therapeutic milieu -Discharge planning will be ongoing  Sanjuana Kava, NP, PMHNP, FNP-BC 05/03/2017, 2:26 PMPatient ID: Vilinda Blanks, male   DOB: 1961-04-04, 56 y.o.   MRN: [id number]

## 2017-05-03 NOTE — Progress Notes (Signed)
D: When asked about his day pt stated, "I wish they would give me something for my headache". Writer offered tylenol, but pt refused. Pt also refused his hs insulin coverage. Stated that if "we would give him all of his medicine it wouldn't be this high". Stated he takes 1000mg  of metformin bid.  When asked about thoughts of harming people pt stated, "I think of harming people everyday". When asked how he copes or handles it pt stated, "be alone. I'd rather be alone". Pt has no other questions or concerns.    A:  Support and encouragement was offered. 15 min checks continued for safety.  R: Pt remains safe.

## 2017-05-04 LAB — GLUCOSE, CAPILLARY
GLUCOSE-CAPILLARY: 321 mg/dL — AB (ref 65–99)
Glucose-Capillary: 288 mg/dL — ABNORMAL HIGH (ref 65–99)
Glucose-Capillary: 479 mg/dL — ABNORMAL HIGH (ref 65–99)
Glucose-Capillary: 271 mg/dL — ABNORMAL HIGH (ref 65–99)

## 2017-05-04 MED ORDER — AMLODIPINE BESYLATE 5 MG PO TABS
5.0000 mg | ORAL_TABLET | Freq: Every day | ORAL | Status: DC
  Administered 2017-05-04 – 2017-05-06 (×3): 5 mg via ORAL
  Filled 2017-05-04 (×6): qty 1

## 2017-05-04 MED ORDER — GLIPIZIDE ER 10 MG PO TB24: 10.0000 mg | ORAL_TABLET | Freq: Every day | ORAL | Status: DC

## 2017-05-04 MED ORDER — TRAZODONE HCL 150 MG PO TABS
150.0000 mg | ORAL_TABLET | Freq: Every evening | ORAL | Status: DC | PRN
  Administered 2017-05-04 – 2017-05-06 (×5): 150 mg via ORAL
  Filled 2017-05-04 (×11): qty 1

## 2017-05-04 MED ORDER — GLIPIZIDE ER 10 MG PO TB24
10.0000 mg | ORAL_TABLET | Freq: Every day | ORAL | Status: DC
  Administered 2017-05-04 – 2017-05-06 (×3): 10 mg via ORAL
  Filled 2017-05-04 (×6): qty 1

## 2017-05-04 MED ORDER — ROSUVASTATIN CALCIUM 10 MG PO TABS
10.0000 mg | ORAL_TABLET | Freq: Every day | ORAL | Status: DC
  Administered 2017-05-04 – 2017-05-06 (×3): 10 mg via ORAL
  Filled 2017-05-04 (×5): qty 1

## 2017-05-04 MED ORDER — INSULIN GLARGINE 100 UNIT/ML ~~LOC~~ SOLN
40.0000 [IU] | Freq: Every day | SUBCUTANEOUS | Status: DC
  Administered 2017-05-04 – 2017-05-05 (×2): 40 [IU] via SUBCUTANEOUS

## 2017-05-04 NOTE — Progress Notes (Signed)
[facility] MD Progress Note  05/04/2017 11:04 AM Stephen Mann  MRN:  [id number]  Subjective: Stephen Mann reports, "I'm not in  Good mood today. I did not get any sleep last night. That is why I'm not in a good mood today".  Objective: Stephen Mann is a 56 y/o M with history of schizoaffective disorder who was admitted on IVC as a transfer from [facility] with worsening depression, SI, HI, and hallucinations. He was started on combination of depakote and invega, which he has been tolerating without difficulty or side effects. As per RN report, pt had initially refused insulin 2 evenings ago but with encouragement pt was accepting of his prescribed medications. Today, 05-04-17, upon evaluation, pt reports his mood is not good today. He blamed it on not sleeping well at night. He denies SI. He continues to report HI not directed at a specific individual, and he comments, "That's pretty much an every day thing." Pt is able to contract for safety while in the hospital. He denies AVH today. Pt says he is not sleeping well & that is the reason for being in a bad mood today.  Appetite is good. He is feeling that his hospitalization has been helping his mood to some extent, and he would like to remain in the hospital at this time as he does not feel safe to leave. Pt agrees to continue his current medication regimen, and we will plan to obtain a depakote level on Monday AM (05/05/17). Pt had no further questions, comments, or concerns. He continues to endorse headache pain, says only Oxycodone tablethelps. Increased Trazodone from 100 mg prn to 150 mg routinely Q hs. Increased the Metformin from 100 mg daily to bid due to elevated blood sugar.  Principal Problem: Schizoaffective disorder (HCC) Diagnosis:   Patient Active Problem List   Diagnosis Date Noted  . Schizoaffective disorder (HCC) [F25.9] 05/01/2017  . MDD (major depressive disorder) [F32.9] 04/30/2017   Total Time spent with patient: 15 minutes  Past  Psychiatric History: See H&P  Past Medical History:  Past Medical History:  Diagnosis Date  . Anxiety   . Asthma   . COPD (chronic obstructive pulmonary disease) (HCC)   . Diabetes mellitus without complication (HCC)   . GERD (gastroesophageal reflux disease)   . Hypertension   . Stroke [facility])    History reviewed. No pertinent surgical history. Family History: History reviewed. No pertinent family history.  Family Psychiatric  History: See H&P  Social History:  Social History   Substance and Sexual Activity  Alcohol Use Yes   Comment: Unk      Social History   Substance and Sexual Activity  Drug Use No    Social History   Socioeconomic History  . Marital status: Single    Spouse name: None  . Number of children: None  . Years of education: None  . Highest education level: None  Social Needs  . Financial resource strain: None  . Food insecurity - worry: None  . Food insecurity - inability: None  . Transportation needs - medical: None  . Transportation needs - non-medical: None  Occupational History  . None  Tobacco Use  . Smoking status: Current Every Day Smoker    Types: Cigarettes  . Smokeless tobacco: Never Used  Substance and Sexual Activity  . Alcohol use: Yes    Comment: Unk   . Drug use: No  . Sexual activity: Yes    Birth control/protection: None  Other Topics Concern  .  None  Social History Narrative  . None   Additional Social History:    Pain Medications: See MAR  Prescriptions: See MAR  Over the Counter: See MAR  History of alcohol / drug use?: Yes Longest period of sobriety (when/how long): None  Name of Substance 1: cocaine 1 - Frequency: ongoing Name of Substance 2: Amphetamines 2 - Frequency: ongoing Name of Substance 3: Alcohol 3 - Age of First Use: 6 3 - Frequency: ongoing  Sleep: Fair  Appetite:  Fair  Current Medications: Current Facility-Administered Medications  Medication Dose Route Frequency Provider Last Rate Last  Dose  . acetaminophen (TYLENOL) tablet 650 mg  650 mg Oral Q6H PRN Rankin, Shuvon B, NP      . alum & mag hydroxide-simeth (MAALOX/MYLANTA) 200-200-20 MG/5ML suspension 30 mL  30 mL Oral Q4H PRN Rankin, Shuvon B, NP      . divalproex (DEPAKOTE ER) 24 hr tablet 750 mg  750 mg Oral QHS Micheal Likens, MD   750 mg at 05/03/17 2103  . hydrOXYzine (ATARAX/VISTARIL) tablet 25 mg  25 mg Oral Q6H PRN Kerry Hough, PA-C   25 mg at 05/04/17 7893  . insulin aspart (novoLOG) injection 0-15 Units  0-15 Units Subcutaneous TID WC Donell Sievert E, PA-C   8 Units at 05/04/17 0630  . insulin aspart (novoLOG) injection 0-5 Units  0-5 Units Subcutaneous QHS Kerry Hough, PA-C   5 Units at 05/02/17 2114  . magnesium hydroxide (MILK OF MAGNESIA) suspension 30 mL  30 mL Oral Daily PRN Rankin, Shuvon B, NP      . methocarbamol (ROBAXIN) tablet 500 mg  500 mg Oral Q8H PRN Armandina Stammer I, NP   500 mg at 05/04/17 0800  . metoprolol succinate (TOPROL-XL) 24 hr tablet 50 mg  50 mg Oral Daily Rankin, Shuvon B, NP   50 mg at 05/04/17 0758  . paliperidone (INVEGA) 24 hr tablet 6 mg  6 mg Oral Daily Micheal Likens, MD   6 mg at 05/04/17 0758  . pantoprazole (PROTONIX) EC tablet 40 mg  40 mg Oral Daily Armandina Stammer I, NP   40 mg at 05/04/17 0758  . pregabalin (LYRICA) capsule 75 mg  75 mg Oral BID Rankin, Shuvon B, NP   75 mg at 05/04/17 0758  . traZODone (DESYREL) tablet 150 mg  150 mg Oral QHS,MR X 1 Nwoko, Agnes I, NP        Lab Results:  Results for orders placed or performed during the hospital encounter of 04/30/17 (from the past 48 hour(s))  Glucose, capillary     Status: Abnormal   Collection Time: 05/02/17 12:03 PM  Result Value Ref Range   Glucose-Capillary 302 (H) 65 - 99 mg/dL  Glucose, capillary     Status: Abnormal   Collection Time: 05/02/17  5:06 PM  Result Value Ref Range   Glucose-Capillary 306 (H) 65 - 99 mg/dL   Comment 1 Notify RN    Comment 2 Document in Chart   Glucose,  capillary     Status: Abnormal   Collection Time: 05/02/17  9:08 PM  Result Value Ref Range   Glucose-Capillary 414 (H) 65 - 99 mg/dL  Glucose, capillary     Status: Abnormal   Collection Time: 05/03/17  5:24 AM  Result Value Ref Range   Glucose-Capillary 290 (H) 65 - 99 mg/dL  Glucose, capillary     Status: Abnormal   Collection Time: 05/03/17 12:06 PM  Result Value Ref  Range   Glucose-Capillary 331 (H) 65 - 99 mg/dL  Glucose, capillary     Status: Abnormal   Collection Time: 05/03/17  5:11 PM  Result Value Ref Range   Glucose-Capillary 361 (H) 65 - 99 mg/dL  Glucose, capillary     Status: Abnormal   Collection Time: 05/03/17  8:16 PM  Result Value Ref Range   Glucose-Capillary 323 (H) 65 - 99 mg/dL  Glucose, capillary     Status: Abnormal   Collection Time: 05/04/17  6:14 AM  Result Value Ref Range   Glucose-Capillary 288 (H) 65 - 99 mg/dL   Blood Alcohol level:  Lab Results  Component Value Date   ETH <11 12/18/2012   ETH <11 12/18/2012   Metabolic Disorder Labs: No results found for: HGBA1C, MPG No results found for: PROLACTIN No results found for: CHOL, TRIG, HDL, CHOLHDL, VLDL, LDLCALC  Physical Findings: AIMS: Facial and Oral Movements Muscles of Facial Expression: None, normal Lips and Perioral Area: None, normal Jaw: None, normal Tongue: None, normal,Extremity Movements Upper (arms, wrists, hands, fingers): None, normal Lower (legs, knees, ankles, toes): None, normal, Trunk Movements Neck, shoulders, hips: None, normal, Overall Severity Severity of abnormal movements (highest score from questions above): None, normal Incapacitation due to abnormal movements: None, normal Patient's awareness of abnormal movements (rate only patient's report): No Awareness, Dental Status Current problems with teeth and/or dentures?: Yes Does patient usually wear dentures?: No  CIWA:    COWS:     Musculoskeletal: Strength & Muscle Tone: within normal limits Gait & Station:  normal Patient leans: N/A  Psychiatric Specialty Exam: Physical Exam  Nursing note and vitals reviewed.   Review of Systems  Constitutional: Negative for chills and fever.  Respiratory: Negative for cough.   Cardiovascular: Negative for chest pain.  Gastrointestinal: Negative for heartburn and nausea.  Psychiatric/Behavioral: Positive for hallucinations. Negative for depression and suicidal ideas. The patient is nervous/anxious.     Blood pressure (!) 141/72, pulse 87, temperature 97.7 F (36.5 C), resp. rate 18, height 5' 7.32" (1.71 m), weight 94.3 kg (208 lb).Body mass index is 32.27 kg/m.  General Appearance: Casual and Disheveled  Eye Contact:  Good  Speech:  Clear and Coherent and Normal Rate  Volume:  Normal  Mood:  Dysphoric and Irritable  Affect:  Congruent and Constricted, at times reactive,  Thought Process:  Coherent and Goal Directed  Orientation:  Full (Time, Place, and Person)  Thought Content:  Hallucinations: Visual, auditory  Suicidal Thoughts:  Denies  Homicidal Thoughts:  Yes.  without intent/plan, says, always on going.  Memory:  Immediate;   Good Recent;   Good Remote;   Good  Judgement:  Fair  Insight:  Fair  Psychomotor Activity:  Normal  Concentration:  Concentration: Good  Recall:  Good  Fund of Knowledge:  Good  Language:  Good  Akathisia:  No  Handed:    AIMS (if indicated):     Assets:  Communication Skills Resilience Social Support  ADL's:  Intact  Cognition:  WNL  Sleep:  Number of Hours: 0.5   Treatment Plan Summary: Daily contact with patient to assess and evaluate symptoms and progress in treatment and Medication management. Pt denies any AVH today. However, he says his mood is not good because he did not sleep well last night. He continued on the new regimen of invega and Depakote upon admission. We will anticipate drawing depakote level of 12/10.  - Continue inpatient hospitalization  - Labs: depakote level on 05/05/17  AM  Will continue today 05/04/2017 plan as below except where it is noted.  - Schizoaffective disorder             - Continue Invega 6mg  qDay             - Continue Depakote ER 750mg  qhs - Anxiety              - Continue atarax 25mg  q8h prn anxiety - Insomnia             -Increased trazodone from 100mg  to 150 mg qhs routinely.  - DMII             - Continue SSI.             - Re-instated on Glucotrol XL 10 mg at breakfast due to elevated blood sugar.  - HTN             - Continue toprol-XL  - Chronic pain             - Continue Lyrica  - GERD    - Continue protonix  - Encourage participation in groups and therapeutic milieu -Discharge planning will be ongoing  Sanjuana Kava, NP, PMHNP, FNP-BC 05/04/2017, 11:04 AMPatient ID: Stephen Mann, male   DOB: [birth year], 71 y.o.   MRN: [id number]

## 2017-05-04 NOTE — Progress Notes (Addendum)
D. Pt in bed resting upon initial approach- Pt reports not having slept well last night, despite sleep medication. Pt continues to complain of headache,back and leg pain but refuses tylenol. Pt agreed to prn med for back muscle spasms. Per pt's self inventory, pt rates his depression, hopelessness and anxiety a 8/7/8, respectively. Pt currently denies SI/HI and AVH.  A. Labs and vitals monitored. Pt compliant with medications. Pt supported emotionally and encouraged to express concerns and ask questions.   R. Pt remains safe with 15 minute checks. Will continue POC.

## 2017-05-04 NOTE — Plan of Care (Signed)
Pt safe on the unit at this time 

## 2017-05-04 NOTE — Progress Notes (Signed)
D: Pt +ve HI, denies SI/ HI/ AVH. Pt is pleasant and cooperative. Pt stated he was doing better due to the medications.   A: Pt was offered support and encouragement. Pt was given scheduled medications. Pt was encourage to attend groups. Q 15 minute checks were done for safety.   R:Pt attends groups and interacts well with peers and staff. Pt is taking medication. Pt has no complaints at this time .Pt receptive to treatment and safety maintained on unit.

## 2017-05-04 NOTE — Progress Notes (Signed)
Patient observed in dayroom much of the day watching Christmas movies. Pt did not attend the SW's group this am, stating, "I don't like music". Pt has been more sociable and agreeable today- less irritable during interactions.

## 2017-05-04 NOTE — [facility] Group Notes (Signed)
[facility] LCSW Group Therapy Note  Date/Time:  05/04/2017  11:00AM-12:00PM  Type of Therapy and Topic:  Group Therapy:  Music and Mood  Participation Level:  Did Not Attend   Description of Group: In this process group, members listened to a variety of genres of music and identified that different types of music evoke different responses.  Patients were encouraged to identify music that was soothing for them and music that was energizing for them.  Patients discussed how this knowledge can help with wellness and recovery in various ways including managing depression and anxiety as well as encouraging healthy sleep habits.    Therapeutic Goals: 1. Patients will explore the impact of different varieties of music on mood 2. Patients will verbalize the thoughts they have when listening to different types of music 3. Patients will identify music that is soothing to them as well as music that is energizing to them 4. Patients will discuss how to use this knowledge to assist in maintaining wellness and recovery 5. Patients will explore the use of music as a coping skill  Summary of Patient Progress:  Patient refused to come to group, stating there were too many people.  Therapeutic Modalities: Solution Focused Brief Therapy Motivational Interviewing Activity   Ambrose Mantle, LCSW 05/04/2017 11:52 AM

## 2017-05-05 LAB — GLUCOSE, CAPILLARY
GLUCOSE-CAPILLARY: 368 mg/dL — AB (ref 65–99)
Glucose-Capillary: 212 mg/dL — ABNORMAL HIGH (ref 65–99)
Glucose-Capillary: 287 mg/dL — ABNORMAL HIGH (ref 65–99)
Glucose-Capillary: 371 mg/dL — ABNORMAL HIGH (ref 65–99)

## 2017-05-05 LAB — VALPROIC ACID LEVEL: VALPROIC ACID LVL: 45 ug/mL — AB (ref 50.0–100.0)

## 2017-05-05 MED ORDER — DIVALPROEX SODIUM ER 500 MG PO TB24
1000.0000 mg | ORAL_TABLET | Freq: Every day | ORAL | Status: DC
  Administered 2017-05-05 – 2017-05-06 (×2): 1000 mg via ORAL
  Filled 2017-05-05 (×5): qty 2

## 2017-05-05 MED ORDER — LORATADINE 10 MG PO TABS
10.0000 mg | ORAL_TABLET | Freq: Every day | ORAL | Status: DC
  Administered 2017-05-05 – 2017-05-06 (×2): 10 mg via ORAL
  Filled 2017-05-05 (×6): qty 1

## 2017-05-05 MED ORDER — GUAIFENESIN ER 600 MG PO TB12
600.0000 mg | ORAL_TABLET | Freq: Two times a day (BID) | ORAL | Status: DC | PRN
  Administered 2017-05-05 (×2): 600 mg via ORAL
  Filled 2017-05-05 (×2): qty 1

## 2017-05-05 MED ORDER — SALINE SPRAY 0.65 % NA SOLN: 1.0000 | NASAL | Status: DC | PRN

## 2017-05-05 MED ORDER — LIDOCAINE 5 % EX PTCH
1.0000 | MEDICATED_PATCH | Freq: Every day | CUTANEOUS | Status: DC
  Administered 2017-05-05: 1 via TRANSDERMAL
  Filled 2017-05-05 (×5): qty 1

## 2017-05-05 NOTE — Progress Notes (Signed)
D:  Patient denied SI.  HI to other people in general, not to [facility] staff.  Denied A/V hallucinations.   A:  Medications administered per MD orders.  Emotional support and encouragement given patient. R:  Safety maintained with 15 minute checks. Patient has stayed in bed most of day.  Did go to dining room for meals.

## 2017-05-05 NOTE — Plan of Care (Signed)
Nurse discussed depression, anxiety, coping skills with patient.  

## 2017-05-05 NOTE — Progress Notes (Signed)
D: Pt stated he was doing ok A: Pt was offered support and encouragement. Pt was given scheduled medications. Pt was encourage to attend groups. Q 15 minute checks were done for safety.   R:Pt attends groups and interacts well with peers and staff. Pt is taking medication. Pt has no complaints.Pt receptive to treatment and safety maintained on unit.

## 2017-05-05 NOTE — Plan of Care (Signed)
  Education: Mental status will improve 05/05/2017 2119 - Progressing by Delos Haring, RN Note Pt stated he was doing good   Safety: Periods of time without injury will increase 05/05/2017 2119 - Progressing by Delos Haring, RN Note Pt safe at this time

## 2017-05-05 NOTE — Progress Notes (Signed)
Patient ID: Stephen Mann, male   DOB: [birth year], 56 y.o.   MRN: [id number] PER STATE REGULATIONS 482.30  THIS CHART WAS REVIEWED FOR MEDICAL NECESSITY WITH RESPECT TO THE PATIENT'S ADMISSION/ DURATION OF STAY.  NEXT REVIEW DATE: 05/08/2017  Willa Rough, RN, BSN CASE MANAGER
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
## 2017-05-06 LAB — GLUCOSE, CAPILLARY
GLUCOSE-CAPILLARY: 268 mg/dL — AB (ref 65–99)
GLUCOSE-CAPILLARY: 236 mg/dL — AB (ref 65–99)
Glucose-Capillary: 252 mg/dL — ABNORMAL HIGH (ref 65–99)
Glucose-Capillary: 348 mg/dL — ABNORMAL HIGH (ref 65–99)

## 2017-05-06 MED ORDER — PALIPERIDONE PALMITATE 234 MG/1.5ML IM SUSP
234.0000 mg | Freq: Once | INTRAMUSCULAR | Status: AC
  Administered 2017-05-06: 234 mg via INTRAMUSCULAR

## 2017-05-06 MED ORDER — PALIPERIDONE PALMITATE 156 MG/ML IM SUSP: 156.0000 mg | INTRAMUSCULAR | Status: DC

## 2017-05-06 NOTE — Progress Notes (Signed)
Did not attend group 

## 2017-05-06 NOTE — Progress Notes (Signed)
D: Pt was at the nurse's station upon initial approach.  Pt presents with appropriate affect and mood.  Pt denies SI, denies hallucinations.  Denies having a goal today.  He reports he is discharging tomorrow and he feels safe to do so.  Pt reports he hopes to go to a shelter after discharge.  He reports he "always" has thoughts of harming others; pt verbally contracts for safety.  Pt has been visible in milieu interacting with peers and staff appropriately.  Pt did not attend evening group.    A: Introduced self to pt.  Actively listened to pt and offered support and encouragement. Medications administered per order.  PRN medication administered for muscle spasms.  Q15 minute safety checks maintained.  R: Pt is safe on the unit.  Pt is compliant with medications.  Pt verbally contracts for safety.  Will continue to monitor and assess.

## 2017-05-06 NOTE — Progress Notes (Signed)
DAR NOTE: Patient presents with anxious affect and depressed mood. Pt was irritable this morning about his break because was enough for him. Pt was encouraged to go to the cafeteria for meals. Pt refused his lunch and also refused his insulin. MD made aware. Denies pain, auditory and visual hallucinations.  Rates depression at 4, hopelessness at 4, and anxiety at 4.  Maintained on routine safety checks.  Medications given as prescribed.  Support and encouragement offered as needed. Will continue to monitor.

## 2017-05-06 NOTE — Progress Notes (Signed)
[facility] MD Progress Note  05/06/2017 12:15 PM Stephen Mann  MRN:  [id number] Subjective:   Stephen Mann is a 56 y/o M with history of schizoaffective disorder who was admitted on IVC as a transfer from [facility] with worsening depression, SI, HI, and hallucinations. He was started on combination of depakote and invega, which he has been tolerating without difficulty or side effects, and pt has been reporting improvement of his presenting symptoms. Today upon evaluation, pt reports he is doing well overall. He denies SI/HI/AH/VH. He reports his sleep is "so-so," but overall he feels adequately rested. He complains of a head ache this AM, but otherwise he has no complaints. He expresses interest in the long-acting injectable form of Invega, and we discussed that pt has tolerated oral Invega well so he is an appropriate candidate. Pt agrees to start [place] and he verbalized good understanding that he will need to have a his first booster injection in 7 days. Pt has been working with Child psychotherapist to arrange for transportation back to [place], and pt is anticipating discharge tomorrow with plan for follow up at [facility] in [place].  Principal Problem: Schizoaffective disorder (HCC) Diagnosis:   Patient Active Problem List   Diagnosis Date Noted  . Schizoaffective disorder (HCC) [F25.9] 05/01/2017  . MDD (major depressive disorder) [F32.9] 04/30/2017   Total Time spent with patient: 30 minutes  Past Psychiatric History: see H&P  Past Medical History:  Past Medical History:  Diagnosis Date  . Anxiety   . Asthma   . COPD (chronic obstructive pulmonary disease) (HCC)   . Diabetes mellitus without complication (HCC)   . GERD (gastroesophageal reflux disease)   . Hypertension   . Stroke [facility])    History reviewed. No pertinent surgical history. Family History: History reviewed. No pertinent family history. Family Psychiatric  History: see H&P Social History:  Social History    Substance and Sexual Activity  Alcohol Use Yes   Comment: Unk      Social History   Substance and Sexual Activity  Drug Use No    Social History   Socioeconomic History  . Marital status: Single    Spouse name: None  . Number of children: None  . Years of education: None  . Highest education level: None  Social Needs  . Financial resource strain: None  . Food insecurity - worry: None  . Food insecurity - inability: None  . Transportation needs - medical: None  . Transportation needs - non-medical: None  Occupational History  . None  Tobacco Use  . Smoking status: Current Every Day Smoker    Types: Cigarettes  . Smokeless tobacco: Never Used  Substance and Sexual Activity  . Alcohol use: Yes    Comment: Unk   . Drug use: No  . Sexual activity: Yes    Birth control/protection: None  Other Topics Concern  . None  Social History Narrative  . None   Additional Social History:    Pain Medications: See MAR  Prescriptions: See MAR  Over the Counter: See MAR  History of alcohol / drug use?: Yes Longest period of sobriety (when/how long): None  Name of Substance 1: cocaine 1 - Frequency: ongoing Name of Substance 2: Amphetamines 2 - Frequency: ongoing Name of Substance 3: Alcohol 3 - Age of First Use: 6 3 - Frequency: ongoing              Sleep: Fair  Appetite:  Good  Current Medications: Current Facility-Administered Medications  Medication Dose Route Frequency Provider Last Rate Last Dose  . acetaminophen (TYLENOL) tablet 650 mg  650 mg Oral Q6H PRN Rankin, Shuvon B, NP      . alum & mag hydroxide-simeth (MAALOX/MYLANTA) 200-200-20 MG/5ML suspension 30 mL  30 mL Oral Q4H PRN Rankin, Shuvon B, NP      . amLODipine (NORVASC) tablet 5 mg  5 mg Oral Daily Micheal Likens, MD   5 mg at 05/06/17 0810  . divalproex (DEPAKOTE ER) 24 hr tablet 1,000 mg  1,000 mg Oral QHS Oneta Rack, NP   1,000 mg at 05/05/17 2138  . glipiZIDE (GLUCOTROL XL) 24  hr tablet 10 mg  10 mg Oral Q breakfast Armandina Stammer I, NP   10 mg at 05/06/17 0809  . guaiFENesin (MUCINEX) 12 hr tablet 600 mg  600 mg Oral BID PRN Nira Conn A, NP   600 mg at 05/05/17 1212  . hydrOXYzine (ATARAX/VISTARIL) tablet 25 mg  25 mg Oral Q6H PRN Kerry Hough, PA-C   25 mg at 05/05/17 2138  . insulin aspart (novoLOG) injection 0-15 Units  0-15 Units Subcutaneous TID WC Donell Sievert E, PA-C   8 Units at 05/06/17 8638  . insulin aspart (novoLOG) injection 0-5 Units  0-5 Units Subcutaneous QHS Kerry Hough, PA-C   5 Units at 05/05/17 2141  . insulin glargine (LANTUS) injection 40 Units  40 Units Subcutaneous Q1200 Micheal Likens, MD   40 Units at 05/05/17 1205  . lidocaine (LIDODERM) 5 % 1 patch  1 patch Transdermal Daily Oneta Rack, NP   1 patch at 05/05/17 1211  . loratadine (CLARITIN) tablet 10 mg  10 mg Oral Daily Nira Conn A, NP   10 mg at 05/06/17 0809  . magnesium hydroxide (MILK OF MAGNESIA) suspension 30 mL  30 mL Oral Daily PRN Rankin, Shuvon B, NP      . methocarbamol (ROBAXIN) tablet 500 mg  500 mg Oral Q8H PRN Armandina Stammer I, NP   500 mg at 05/05/17 2138  . metoprolol succinate (TOPROL-XL) 24 hr tablet 50 mg  50 mg Oral Daily Rankin, Shuvon B, NP   50 mg at 05/06/17 0809  . [START ON 05/13/2017] paliperidone (INVEGA SUSTENNA) injection 156 mg  156 mg Intramuscular Q30 days Jolyne Loa T, MD      . pantoprazole (PROTONIX) EC tablet 40 mg  40 mg Oral Daily Armandina Stammer I, NP   40 mg at 05/06/17 0809  . pregabalin (LYRICA) capsule 75 mg  75 mg Oral BID Rankin, Shuvon B, NP   75 mg at 05/06/17 0811  . rosuvastatin (CRESTOR) tablet 10 mg  10 mg Oral q1800 Micheal Likens, MD   10 mg at 05/05/17 1842  . sodium chloride (OCEAN) 0.65 % nasal spray 1 spray  1 spray Each Nare PRN Nira Conn A, NP      . traZODone (DESYREL) tablet 150 mg  150 mg Oral QHS,MR X 1 Nwoko, Agnes I, NP   150 mg at 05/05/17 2302    Lab Results:  Results for  orders placed or performed during the hospital encounter of 04/30/17 (from the past 48 hour(s))  Glucose, capillary     Status: Abnormal   Collection Time: 05/04/17  4:41 PM  Result Value Ref Range   Glucose-Capillary 321 (H) 65 - 99 mg/dL  Glucose, capillary     Status: Abnormal   Collection Time: 05/04/17  8:36 PM  Result Value Ref Range  Glucose-Capillary 271 (H) 65 - 99 mg/dL  Glucose, capillary     Status: Abnormal   Collection Time: 05/05/17  5:26 AM  Result Value Ref Range   Glucose-Capillary 212 (H) 65 - 99 mg/dL  Valproic acid level     Status: Abnormal   Collection Time: 05/05/17  6:30 AM  Result Value Ref Range   Valproic Acid Lvl 45 (L) 50.0 - 100.0 ug/mL    Comment: Performed at [facility], [address], [place], [place] [address]  Glucose, capillary     Status: Abnormal   Collection Time: 05/05/17 11:55 AM  Result Value Ref Range   Glucose-Capillary 287 (H) 65 - 99 mg/dL  Glucose, capillary     Status: Abnormal   Collection Time: 05/05/17  4:45 PM  Result Value Ref Range   Glucose-Capillary 368 (H) 65 - 99 mg/dL  Glucose, capillary     Status: Abnormal   Collection Time: 05/05/17  7:26 PM  Result Value Ref Range   Glucose-Capillary 371 (H) 65 - 99 mg/dL  Glucose, capillary     Status: Abnormal   Collection Time: 05/06/17  6:18 AM  Result Value Ref Range   Glucose-Capillary 268 (H) 65 - 99 mg/dL  Glucose, capillary     Status: Abnormal   Collection Time: 05/06/17 11:59 AM  Result Value Ref Range   Glucose-Capillary 236 (H) 65 - 99 mg/dL   Comment 1 Notify RN    Comment 2 Document in Chart     Blood Alcohol level:  Lab Results  Component Value Date   ETH <11 12/18/2012   ETH <11 12/18/2012    Metabolic Disorder Labs: No results found for: HGBA1C, MPG No results found for: PROLACTIN No results found for: CHOL, TRIG, HDL, CHOLHDL, VLDL, LDLCALC  Physical Findings: AIMS: Facial and Oral Movements Muscles of Facial Expression:  None, normal Lips and Perioral Area: None, normal Jaw: None, normal Tongue: None, normal,Extremity Movements Upper (arms, wrists, hands, fingers): None, normal Lower (legs, knees, ankles, toes): None, normal, Trunk Movements Neck, shoulders, hips: None, normal, Overall Severity Severity of abnormal movements (highest score from questions above): None, normal Incapacitation due to abnormal movements: None, normal Patient's awareness of abnormal movements (rate only patient's report): No Awareness, Dental Status Current problems with teeth and/or dentures?: Yes Does patient usually wear dentures?: No  CIWA:  CIWA-Ar Total: 1 COWS:  COWS Total Score: 1  Musculoskeletal: Strength & Muscle Tone: within normal limits Gait & Station: normal Patient leans: N/A  Psychiatric Specialty Exam: Physical Exam  Nursing note and vitals reviewed.   Review of Systems  Constitutional: Negative for chills and fever.  Respiratory: Negative for cough and shortness of breath.   Gastrointestinal: Negative for heartburn and nausea.  Neurological: Positive for headaches.  Psychiatric/Behavioral: Negative for depression, hallucinations and suicidal ideas. The patient is not nervous/anxious.     Blood pressure (!) 159/70, pulse 86, temperature 98.2 F (36.8 C), temperature source Oral, resp. rate 20, height 5' 7.32" (1.71 m), weight 94.3 kg (208 lb).Body mass index is 32.27 kg/m.  General Appearance: Disheveled  Eye Contact:  Good  Speech:  Clear and Coherent and Normal Rate  Volume:  Normal  Mood:  Euthymic  Affect:  Appropriate and Congruent  Thought Process:  Coherent and Goal Directed  Orientation:  Full (Time, Place, and Person)  Thought Content:  Logical  Suicidal Thoughts:  No  Homicidal Thoughts:  No  Memory:  Immediate;   Good Recent;   Good  Remote;   Good  Judgement:  Fair  Insight:  Fair  Psychomotor Activity:  Normal  Concentration:  Concentration: Fair  Recall:  [employer] of  Knowledge:  Fair  Language:  Fair  Akathisia:  No  Handed:    AIMS (if indicated):     Assets:  Manufacturing systems engineer Physical Health Resilience Social Support  ADL's:  Intact  Cognition:  WNL  Sleep:  Number of Hours: 5     Treatment Plan Summary: Daily contact with patient to assess and evaluate symptoms and progress in treatment and Medication management. Pt is reporting improvement and stability of his presenting symptoms. He requests to start long-acting injectable Invega today, and we are anticipating discharge to [place] (with transportation provided by [employer]).  - Continue inpatient hospitalization  - Labs: depakote level on 05/05/17 AM was 45 (dose increased that day after result returned from lab)  - Schizoaffective disorder - Discontinue Invega 6mg  qDay    - Start Hinda Glatter Sustenna 234mg  IM once now and [place] 117mg  IM q28D starting on 05/13/17 - Continue Depakote ER 1000mg  qhs - Anxiety  - Continue atarax 25mg  q8h prn anxiety - Insomnia - Continue trazodone 100mg  qhs prn insomnia - DMII - Continue SSI - HTN - Continue toprol-XL - Chronic pain - Continue Lyrica - GERD                         - Continue protonix  - Encourage participation in groups and therapeutic milieu -Discharge planning will be ongoing    Micheal Likens, MD 05/06/2017, 12:15 PM

## 2017-05-07 DIAGNOSIS — M1990 Unspecified osteoarthritis, unspecified site: Secondary | ICD-10-CM | POA: Diagnosis not present

## 2017-05-07 DIAGNOSIS — E669 Obesity, unspecified: Secondary | ICD-10-CM | POA: Diagnosis not present

## 2017-05-07 DIAGNOSIS — R51 Headache: Secondary | ICD-10-CM | POA: Diagnosis not present

## 2017-05-07 LAB — GLUCOSE, CAPILLARY: Glucose-Capillary: 351 mg/dL — ABNORMAL HIGH (ref 65–99)

## 2017-05-07 MED ORDER — METOPROLOL SUCCINATE ER 50 MG PO TB24: 50.0000 mg | ORAL_TABLET | Freq: Every day | ORAL | 0 refills | Status: AC

## 2017-05-07 MED ORDER — LORATADINE 10 MG PO TABS: 10.0000 mg | ORAL_TABLET | Freq: Every day | ORAL | Status: AC

## 2017-05-07 MED ORDER — AMLODIPINE BESYLATE 5 MG PO TABS: 5.0000 mg | ORAL_TABLET | Freq: Every day | ORAL | 0 refills | Status: AC

## 2017-05-07 MED ORDER — PALIPERIDONE PALMITATE 156 MG/ML IM SUSP: 156.0000 mg | INTRAMUSCULAR | 0 refills | Status: AC

## 2017-05-07 MED ORDER — HYDROXYZINE HCL 25 MG PO TABS: 25.0000 mg | ORAL_TABLET | Freq: Four times a day (QID) | ORAL | 0 refills | Status: AC | PRN

## 2017-05-07 MED ORDER — DIVALPROEX SODIUM ER 500 MG PO TB24: 1000.0000 mg | ORAL_TABLET | Freq: Every day | ORAL | 0 refills | Status: AC

## 2017-05-07 MED ORDER — PREGABALIN 75 MG PO CAPS: 75.0000 mg | ORAL_CAPSULE | Freq: Two times a day (BID) | ORAL | 0 refills | Status: AC

## 2017-05-07 MED ORDER — LIDOCAINE 5 % EX PTCH: 1.0000 | MEDICATED_PATCH | Freq: Every day | CUTANEOUS | 0 refills | Status: AC

## 2017-05-07 MED ORDER — MELOXICAM 15 MG PO TABS: 15.0000 mg | ORAL_TABLET | Freq: Every day | ORAL | Status: AC

## 2017-05-07 MED ORDER — DEXLANSOPRAZOLE 60 MG PO CPDR: 60.0000 mg | DELAYED_RELEASE_CAPSULE | Freq: Every day | ORAL | 0 refills | Status: AC

## 2017-05-07 MED ORDER — GLIPIZIDE ER 10 MG PO TB24: 10.0000 mg | ORAL_TABLET | Freq: Every day | ORAL | 0 refills | Status: AC

## 2017-05-07 MED ORDER — SALINE SPRAY 0.65 % NA SOLN: 1.0000 | NASAL | 0 refills | Status: AC | PRN

## 2017-05-07 MED ORDER — INSULIN DEGLUDEC 200 UNIT/ML ~~LOC~~ SOPN: 40.0000 [IU] | PEN_INJECTOR | Freq: Every day | SUBCUTANEOUS | Status: AC

## 2017-05-07 MED ORDER — TRAZODONE HCL 150 MG PO TABS: ORAL_TABLET | ORAL | 0 refills | Status: AC

## 2017-05-07 MED ORDER — ROSUVASTATIN CALCIUM 10 MG PO TABS: 10.0000 mg | ORAL_TABLET | Freq: Every day | ORAL | 0 refills | Status: AC

## 2017-05-07 NOTE — BHH Suicide Risk Assessment (Signed)
[facility] Discharge Suicide Risk Assessment   Principal Problem: Schizoaffective disorder [facility]) Discharge Diagnoses:  Patient Active Problem List   Diagnosis Date Noted  . Schizoaffective disorder (HCC) [F25.9] 05/01/2017  . MDD (major depressive disorder) [F32.9] 04/30/2017    Total Time spent with patient: 30 minutes  Musculoskeletal: Strength & Muscle Tone: within normal limits Gait & Station: normal Patient leans: N/A  Psychiatric Specialty Exam: Review of Systems  Constitutional: Negative for chills and fever.  Respiratory: Negative for cough.   Cardiovascular: Negative for chest pain.  Gastrointestinal: Negative for heartburn.  Musculoskeletal: Positive for joint pain.  Psychiatric/Behavioral: Negative for depression, hallucinations and suicidal ideas. The patient is not nervous/anxious.     Blood pressure 137/69, pulse 79, temperature 98 F (36.7 C), resp. rate 16, height 5' 7.32" (1.71 m), weight 94.3 kg (208 lb).Body mass index is 32.27 kg/m.  General Appearance: Casual and Disheveled  Eye Contact::  Good  Speech:  Clear and Coherent and Normal Rate  Volume:  Normal  Mood:  Euthymic  Affect:  Congruent and Constricted  Thought Process:  Coherent and Goal Directed  Orientation:  Full (Time, Place, and Person)  Thought Content:  Logical  Suicidal Thoughts:  No  Homicidal Thoughts:  Yes.  without intent/plan  Memory:  Immediate;   Good Recent;   Good Remote;   Good  Judgement:  Fair  Insight:  Fair  Psychomotor Activity:  Normal  Concentration:  Good  Recall:  Good  Fund of Knowledge:Good  Language: Good  Akathisia:  No  Handed:    AIMS (if indicated):     Assets:  Manufacturing systems engineer Physical Health Resilience Social Support  Sleep:  Number of Hours: 6  Cognition: WNL  ADL's:  Intact   Mental Status Per Nursing Assessment::   On Admission:  Self-harm thoughts, Thoughts of violence towards others  Demographic Factors:  Male, Caucasian, Low  socioeconomic status, Living alone and Unemployed  Loss Factors: Legal issues and Financial problems/change in socioeconomic status  Historical Factors: Family history of mental illness or substance abuse and Impulsivity  Risk Reduction Factors:   Positive social support, Positive therapeutic relationship and Positive coping skills or problem solving skills  Continued Clinical Symptoms:  Schizophrenia:   Paranoid or undifferentiated type Chronic Pain More than one psychiatric diagnosis Unstable or Poor Therapeutic Relationship Previous Psychiatric Diagnoses and Treatments Medical Diagnoses and Treatments/Surgeries  Cognitive Features That Contribute To Risk:  None    Suicide Risk:  Minimal: No identifiable suicidal ideation.  Patients presenting with no risk factors but with morbid ruminations; may be classified as minimal risk based on the severity of the depressive symptoms  Follow-up Information    [facility] Follow up on 05/09/2017.   Why:  Friday at 1PM for your hospital follow up appointment Contact information: [address] [place] [address] [phone]         Subjective Data: Subjective:   Stephen Mann is a 56 y/o M with history of schizoaffective disorder who was admitted on IVC as a transfer from [facility] with worsening depression, SI, HI, and hallucinations.He was started on combination of depakote and invega, which he has been tolerating without difficulty or side effects, and pt has been reporting improvement of his presenting symptoms. He was then transitioned to long-acting injectable form of Invega Sustenna yesterday.  Today upon evaluation, pt reports he is doing well overall. He denies SI/AH/VH. He reports HI "are always there" but he does not have a specific  target or intent. He is tolerating his medications well and he verbalized good understanding that he will need a booster injection in 7 days. Pt plans to follow up at  [facility] initially but he would like to switch providers in the coming weeks. Pt was able to engage in safety planning including plan to return to [facility] or contact emergency services if he feels unable to maintain his safety or the safety of others.     Plan Of Care/Follow-up recommendations:  -Discharge to outpatient level of care  - Schizoaffective disorder      - Continue Hinda Glatter Sustenna 117mg  IM q28D starting on 05/13/17 (this is the booster injection, first injection of Sustenna 234mg  IM was given on 05/06/17) -ContinueDepakote ER 1000mg  qhs - Anxiety  -Continueatarax 25mg  q8h prn anxiety - Insomnia -Continuetrazodone 100mg  qhs prn insomnia - DMII - Continue SSI - HTN - Continue toprol-XL - Chronic pain - Continue Lyrica - GERD - Continue protonix   Activity:  as tolerated Diet:  normal Tests:  NA Other:  see above for DC plan  , MD 05/07/2017, 9:14 AM

## 2017-05-07 NOTE — Progress Notes (Signed)
Recreation Therapy Notes  Date: 05/07/17 Time: 1000 Location: 500 Hall Dayroom  Group Topic: Communication  Goal Area(s) Addresses:  Patient will effectively communicate with peers in group.  Patient will verbalize benefit of healthy communication. Patient will verbalize positive effect of healthy communication on post d/c goals.  Patient will identify communication techniques that made activity effective for group.   Intervention:  Blank paper, pencils, geometrical drawings                   Activity: Back to Back Drawings.  Patients were grouped in pairs.  Patients were seated back to back.  One person was given a picture to describe to their partner.  The person listening could not ask any questions of the speaker.  Once they were finished, they could compare pictures to see how accurate the listener was in drawing the picture.  Education:Communication, Discharge Planning  Education Outcome: Acknowledges understanding/In group clarification offered/Needs additional education.   Clinical Observations/Feedback: Pt did not attend group.    Caroll Rancher, LRT/CTRS         Caroll Rancher A 05/07/2017 12:07 PM

## 2017-05-07 NOTE — Progress Notes (Signed)
D:  Patient denied SI.  Denied A/V hallucinations.  Patient stated he always has HI thoughts. A:  Patient refused all morning medications from 2 nurses.  Patient stated he is not taking his medications this morning because his left leg is not moving the way it usually does.  Patient stated this has never happened before today.  Patient is to be discharged this morning.  Sheriff to pick him up for discharge. R:  Safety maintained with 15 minute checks.

## 2017-05-07 NOTE — Discharge Summary (Signed)
Physician Discharge Summary Note  Patient:  Stephen Mann is an 56 y.o., male MRN:  [id number] DOB:  08/27/1960 Patient phone:  [phone] (home)  Patient address:   [address] [place] [address],  Total Time spent with patient: Greater than 30 minutes  Date of Admission:  04/30/2017 Date of Discharge: 05-07-17  Reason for Admission: Worsening symptoms of depression, suicidal/homicidal ideations & hallucinations.  Principal Problem: Schizoaffective disorder [facility])  Discharge Diagnoses: Patient Active Problem List   Diagnosis Date Noted  . Schizoaffective disorder (HCC) [F25.9] 05/01/2017  . MDD (major depressive disorder) [F32.9] 04/30/2017   Past Psychiatric History: Schizoaffective disorder.  Past Medical History:  Past Medical History:  Diagnosis Date  . Anxiety   . Asthma   . COPD (chronic obstructive pulmonary disease) (HCC)   . Diabetes mellitus without complication (HCC)   . GERD (gastroesophageal reflux disease)   . Hypertension   . Stroke [facility])    History reviewed. No pertinent surgical history.  Family History: History reviewed. No pertinent family history.  Family Psychiatric  History: See H&P  Social History:  Social History   Substance and Sexual Activity  Alcohol Use Yes   Comment: Unk      Social History   Substance and Sexual Activity  Drug Use No    Social History   Socioeconomic History  . Marital status: Single    Spouse name: None  . Number of children: None  . Years of education: None  . Highest education level: None  Social Needs  . Financial resource strain: None  . Food insecurity - worry: None  . Food insecurity - inability: None  . Transportation needs - medical: None  . Transportation needs - non-medical: None  Occupational History  . None  Tobacco Use  . Smoking status: Current Every Day Smoker    Types: Cigarettes  . Smokeless tobacco: Never Used  Substance and Sexual Activity  . Alcohol use: Yes    Comment: Unk    . Drug use: No  . Sexual activity: Yes    Birth control/protection: None  Other Topics Concern  . None  Social History Narrative  . None   Hospital Course: Faith Betsill is a 56 y/o M with history of schizoaffective disorder who was admitted on IVC as a transfer from [facility] with worsening depression, SI, HI, and hallucinations.He was started on combination of depakote and invega, which he has been tolerating without difficulty or side effects, and pt has been reporting improvement of his presenting symptoms.He was then transitioned to long-acting injectable form of Invega Sustenna yesterday.  After admission assessment/evaluation, Owyn was started on the medication regimen for his presenting symptoms. He received & was discharged on; Depakote ER 1,000 mg for mood stabilization, Vistaril 25 mg prn for anxiety, Invega 156 mg injections due on 05-13-17 & Trazodone 150 mg for insomnia. He presented other significant health issues that required treatment & or monitoring. He was resumed on all his previous medication regimen for those health issues. Jamerion was enrolled in the group counseling sessions being offered & held on this unit for him to learn coping skills. He declined group session participation because he says he does not like people. He did tolerate his treatment regimen without any adverse effects or reactions reported.  Today upon his discharge evaluation by the attending psychiatrist, pt reports he is doing well overall. He denies SI/AH/VH. He reports HI "are always there" but he does not have a specific target or intent.  He is tolerating his medications well and he verbalized good understanding that he will need a booster injection in 7 days. Pt plans to follow up at [facility] initially but he would like to switch providers in the coming weeks. Pt was able to engage in safety planning including plan to return to [facility] or contact emergency services if he feels unable to maintain  his safety or the safety of others.  He left [facility] with all personal belongings in no apparent distress. Transportation per the [facility].   Physical Findings: AIMS: Facial and Oral Movements Muscles of Facial Expression: None, normal Lips and Perioral Area: None, normal Jaw: None, normal Tongue: None, normal,Extremity Movements Upper (arms, wrists, hands, fingers): None, normal Lower (legs, knees, ankles, toes): None, normal, Trunk Movements Neck, shoulders, hips: None, normal, Overall Severity Severity of abnormal movements (highest score from questions above): None, normal Incapacitation due to abnormal movements: None, normal Patient's awareness of abnormal movements (rate only patient's report): No Awareness, Dental Status Current problems with teeth and/or dentures?: Yes Does patient usually wear dentures?: No  CIWA:  CIWA-Ar Total: 1 COWS:  COWS Total Score: 1  Musculoskeletal: Strength & Muscle Tone: within normal limits Gait & Station: normal Patient leans: N/A  Psychiatric Specialty Exam: Physical Exam  Constitutional: He appears well-developed.  HENT:  Head: Normocephalic.  Eyes: Pupils are equal, round, and reactive to light.  Cardiovascular: Normal rate.  Respiratory: Effort normal.  GI: Soft.  Genitourinary:  Genitourinary Comments: Deferred  Musculoskeletal: Normal range of motion.  Neurological: He is alert.  Skin: Skin is warm.    Review of Systems  Constitutional: Negative.   HENT: Negative.   Eyes: Negative.   Respiratory: Negative.   Cardiovascular: Negative.   Gastrointestinal: Negative.   Genitourinary: Negative.   Musculoskeletal: Negative.   Skin: Negative.   Neurological: Negative.   Endo/Heme/Allergies: Negative.   Psychiatric/Behavioral: Positive for depression (Stabilized with medication prior to discharge), hallucinations (Hx. auditory hallucinations) and substance abuse (Hx. Cocaine use disorder). Negative for memory loss and  suicidal ideas. The patient has insomnia (Stabilized with medication prior to discharge). The patient is not nervous/anxious.     Blood pressure 137/69, pulse 79, temperature 98 F (36.7 C), resp. rate 16, height 5' 7.32" (1.71 m), weight 94.3 kg (208 lb).Body mass index is 32.27 kg/m.  See Md's SRA   Have you used any form of tobacco in the last 30 days? (Cigarettes, Smokeless Tobacco, Cigars, and/or Pipes): Yes  Has this patient used any form of tobacco in the last 30 days? (Cigarettes, Smokeless Tobacco, Cigars, and/or Pipes):  N/A  Blood Alcohol level:  Lab Results  Component Value Date   [facility] <11 12/18/2012   ETH <11 12/18/2012   Metabolic Disorder Labs:  No results found for: HGBA1C, MPG No results found for: PROLACTIN No results found for: CHOL, TRIG, HDL, CHOLHDL, VLDL, LDLCALC  See Psychiatric Specialty Exam and Suicide Risk Assessment completed by Attending Physician prior to discharge.  Discharge destination:  Home  Is patient on multiple antipsychotic therapies at discharge:  No   Has Patient had three or more failed trials of antipsychotic monotherapy by history:  No  Recommended Plan for Multiple Antipsychotic Therapies: NA  Allergies as of 05/07/2017      Reactions   Cimetidine Nausea And Vomiting   Other    Pt has an intolerance to corn kernels due to having diverticulitis.  He can take pills with corn starch fillers      Medication List  STOP taking these medications   albuterol 108 (90 Base) MCG/ACT inhaler Commonly known as:  PROVENTIL HFA;VENTOLIN HFA   budesonide-formoterol 160-4.5 MCG/ACT inhaler Commonly known as:  SYMBICORT   doxazosin 4 MG tablet Commonly known as:  CARDURA   HYDROcodone-acetaminophen 10-325 MG tablet Commonly known as:  NORCO   LYRICA CR 165 MG Tb24 Generic drug:  Pregabalin ER   Oxcarbazepine 300 MG tablet Commonly known as:  TRILEPTAL     TAKE these medications     Indication  amLODipine 5 MG tablet Commonly  known as:  NORVASC Take 1 tablet (5 mg total) by mouth daily. For high blood pressure What changed:    medication strength  how much to take  additional instructions  Indication:  High Blood Pressure Disorder   dexlansoprazole 60 MG capsule Commonly known as:  DEXILANT Take 1 capsule (60 mg total) by mouth daily. For acid reflux What changed:  additional instructions  Indication:  Gastroesophageal Reflux Disease   divalproex 500 MG 24 hr tablet Commonly known as:  DEPAKOTE ER Take 2 tablets (1,000 mg total) by mouth at bedtime. For mood stabilization  Indication:  Mood stabilization   glipiZIDE 10 MG 24 hr tablet Commonly known as:  GLUCOTROL XL Take 1 tablet (10 mg total) by mouth daily with breakfast. For diabetic management What changed:  additional instructions  Indication:  Type 2 Diabetes   hydrOXYzine 25 MG tablet Commonly known as:  ATARAX/VISTARIL Take 1 tablet (25 mg total) by mouth every 6 (six) hours as needed for anxiety.  Indication:  Feeling Anxious   Insulin Degludec 200 UNIT/ML Sopn Commonly known as:  TRESIBA FLEXTOUCH Inject 40 Units into the skin daily after lunch. For diabetes management What changed:  additional instructions  Indication:  Type 2 Diabetes   lidocaine 5 % Commonly known as:  LIDODERM Place 1 patch onto the skin daily. Remove & Discard patch within 12 hours or as directed by MD: For pain management  Indication:  Pain management   loratadine 10 MG tablet Commonly known as:  CLARITIN Take 1 tablet (10 mg total) by mouth daily. (May buy from over the counter): For allergies  Indication:  Perennial Allergic Rhinitis, Hayfever   meloxicam 15 MG tablet Commonly known as:  MOBIC Take 1 tablet (15 mg total) by mouth daily. For arthritis What changed:  additional instructions  Indication:  Joint Damage causing Pain and Loss of Function   metoprolol succinate 50 MG 24 hr tablet Commonly known as:  TOPROL-XL Take 1 tablet (50 mg total)  by mouth daily. Take with or immediately following a meal: For high blood pressure What changed:  additional instructions  Indication:  High Blood Pressure Disorder   paliperidone 156 MG/ML Susp injection Commonly known as:  INVEGA SUSTENNA Inject 1 mL (156 mg total) into the muscle every 30 (thirty) days. (Dur on 05-13-17): For mood control Start taking on:  05/13/2017  Indication:  Mood control   pregabalin 75 MG capsule Commonly known as:  LYRICA Take 1 capsule (75 mg total) by mouth 2 (two) times daily. For Neuropathic pain What changed:  additional instructions  Indication:  Neuropathic Pain   rosuvastatin 10 MG tablet Commonly known as:  CRESTOR Take 1 tablet (10 mg total) by mouth daily. For high cholesterol What changed:  additional instructions  Indication:  Inherited Heterozygous Hypercholesterolemia   sodium chloride 0.65 % Soln nasal spray Commonly known as:  OCEAN Place 1 spray into both nostrils as needed for congestion.  Indication:  Nasal congestion   traZODone 150 MG tablet Commonly known as:  DESYREL Take 1 tablet (150 mg) by mouth at bedtime: For sleep  Indication:  Trouble Sleeping      Follow-up [facility] [employer] Follow up on 05/09/2017.   Why:  Friday at 1PM for your hospital follow up appointment Contact information: [address] [place] [address] [phone]          Follow-up recommendations: Activity:  As tolerated Diet: As recommended by your primary care doctor. Keep all scheduled follow-up appointments as recommended.   Comments: Patient is instructed prior to discharge to: Take all medications as prescribed by his/her mental healthcare provider. Report any adverse effects and or reactions from the medicines to his/her outpatient provider promptly. Patient has been instructed & cautioned: To not engage in alcohol and or illegal drug use while on prescription medicines. In the event of worsening  symptoms, patient is instructed to call the crisis hotline, 911 and or go to the nearest ED for appropriate evaluation and treatment of symptoms. To follow-up with his/her primary care provider for your other medical issues, concerns and or health care needs.   Signed: Sanjuana Kava, NP, PMHNP, FNP-BC. 05/07/2017, 9:24 AM   Patient seen, Suicide Assessment Completed.  Disposition Plan Reviewed   Clair Diab is a 56 y/o M with history of schizoaffective disorder who was admitted on IVC as a transfer from [facility] with worsening depression, SI, HI, and hallucinations.He was started on combination of depakote and invega, which he has been tolerating without difficulty or side effects, and pt has been reporting improvement of his presenting symptoms.He was then transitioned to long-acting injectable form of Invega Sustenna yesterday.  Today upon evaluation, pt reports he is doing well overall. He denies SI/AH/VH. He reports HI "are always there" but he does not have a specific target or intent. He is tolerating his medications well and he verbalized good understanding that he will need a booster injection in 7 days. Pt plans to follow up at [facility] initially but he would like to switch providers in the coming weeks. Pt was able to engage in safety planning including plan to return to [facility] or contact emergency services if he feels unable to maintain his safety or the safety of others.     Plan Of Care/Follow-up recommendations:  -Discharge to outpatient level of care  - Schizoaffective disorder  - Continue Hinda Glatter Sustenna 117mg  IM q28D starting on 05/13/17 (this is the booster injection, first injection of Sustenna 234mg  IM was given on 05/06/17) -ContinueDepakote ER1000mg  qhs - Anxiety  -Continueatarax 25mg  q8h prn anxiety - Insomnia -Continuetrazodone 100mg  qhs prn insomnia - DMII - Continue SSI -  HTN - Continue toprol-XL - Chronic pain - Continue Lyrica - GERD - Continue protonix   Activity:  as tolerated Diet:  normal Tests:  NA Other:  see above for DC plan  Micheal Likens, MD

## 2017-05-07 NOTE — Progress Notes (Signed)
Patient continues to refuse morning medications.  Nurse helped patient fill out  Suicide safety plan and gave patient survey to fill out.  Patient continues to stay in bed.  Respirations even and unlabored.  No signs/symptoms of pain/distress noted on patient's face/body movements.  Safety maintained with 15 minute checks.

## 2017-05-07 NOTE — Progress Notes (Signed)
  [facility] Adult Case Management Discharge Plan :  Will you be returning to the same living situation after discharge:  Yes,  home At discharge, do you have transportation home?: Yes,  sheriff Do you have the ability to pay for your medications: Yes,  mental health  Release of information consent forms completed and in the chart;  Patient's signature needed at discharge.  Patient to Follow up at: Follow-up Information    [facility] Follow up on 05/09/2017.   Why:  Friday at 1PM for your hospital follow up appointment Contact information: [address] [place] [address] [phone]           Next level of care provider has access to [facility] Link:no  Safety Planning and Suicide Prevention discussed: Yes,  yes  Have you used any form of tobacco in the last 30 days? (Cigarettes, Smokeless Tobacco, Cigars, and/or Pipes): Yes  Has patient been referred to the Quitline?: Patient refused referral  Patient has been referred for addiction treatment: Pt. refused referral  [name], LCSW 05/07/2017, 8:48 AM

## 2017-05-07 NOTE — Progress Notes (Addendum)
Discharge Note:  Patient discharged with  2 police officers.  Patient denied SI and HI.  Denied A/V hallucinations.  Suicide prevention information given to patient who stated he understood and had no questions.  All required discharge information given to patient at discharge.  Patient stated he is looking for his knives.  Security guard informed, items not in his locker.  Security guard stated items may be at [facility] where he went before coming to [facility].  Items not locked up in safe. Patient continued throughout the morning to refuse his morning medications.

## 2017-05-08 ENCOUNTER — Ambulatory Visit: Payer: Medicare Other | Admitting: Neurology

## 2017-05-12 ENCOUNTER — Telehealth: Payer: Self-pay | Admitting: *Deleted

## 2017-05-12 ENCOUNTER — Ambulatory Visit: Payer: Medicare Other | Admitting: Neurology

## 2017-05-12 NOTE — Telephone Encounter (Signed)
No showed new patient appointment. 

## 2017-05-13 ENCOUNTER — Encounter: Payer: Self-pay | Admitting: Neurology

## 2017-06-07 DIAGNOSIS — R079 Chest pain, unspecified: Secondary | ICD-10-CM | POA: Diagnosis not present

## 2017-06-07 DIAGNOSIS — K209 Esophagitis, unspecified: Secondary | ICD-10-CM | POA: Diagnosis not present

## 2017-06-07 DIAGNOSIS — K2920 Alcoholic gastritis without bleeding: Secondary | ICD-10-CM | POA: Diagnosis not present

## 2017-06-07 DIAGNOSIS — K760 Fatty (change of) liver, not elsewhere classified: Secondary | ICD-10-CM | POA: Diagnosis not present

## 2017-06-07 DIAGNOSIS — E119 Type 2 diabetes mellitus without complications: Secondary | ICD-10-CM | POA: Diagnosis not present

## 2017-06-08 DIAGNOSIS — R739 Hyperglycemia, unspecified: Secondary | ICD-10-CM | POA: Diagnosis not present

## 2017-06-08 DIAGNOSIS — R1013 Epigastric pain: Secondary | ICD-10-CM | POA: Diagnosis not present

## 2017-06-08 DIAGNOSIS — E1165 Type 2 diabetes mellitus with hyperglycemia: Secondary | ICD-10-CM | POA: Diagnosis not present

## 2017-06-08 DIAGNOSIS — E119 Type 2 diabetes mellitus without complications: Secondary | ICD-10-CM | POA: Diagnosis not present

## 2017-06-08 DIAGNOSIS — R109 Unspecified abdominal pain: Secondary | ICD-10-CM | POA: Diagnosis not present

## 2017-06-09 DIAGNOSIS — Z09 Encounter for follow-up examination after completed treatment for conditions other than malignant neoplasm: Secondary | ICD-10-CM | POA: Diagnosis not present

## 2017-06-09 DIAGNOSIS — R109 Unspecified abdominal pain: Secondary | ICD-10-CM | POA: Diagnosis not present

## 2017-06-09 DIAGNOSIS — I10 Essential (primary) hypertension: Secondary | ICD-10-CM | POA: Diagnosis not present

## 2017-06-09 DIAGNOSIS — M1990 Unspecified osteoarthritis, unspecified site: Secondary | ICD-10-CM | POA: Diagnosis not present

## 2017-06-09 DIAGNOSIS — Z6832 Body mass index (BMI) 32.0-32.9, adult: Secondary | ICD-10-CM | POA: Diagnosis not present

## 2017-06-09 DIAGNOSIS — E1140 Type 2 diabetes mellitus with diabetic neuropathy, unspecified: Secondary | ICD-10-CM | POA: Diagnosis not present

## 2017-06-23 DIAGNOSIS — F250 Schizoaffective disorder, bipolar type: Secondary | ICD-10-CM | POA: Diagnosis not present

## 2017-06-24 DIAGNOSIS — M545 Low back pain: Secondary | ICD-10-CM | POA: Diagnosis not present

## 2017-06-24 DIAGNOSIS — J449 Chronic obstructive pulmonary disease, unspecified: Secondary | ICD-10-CM | POA: Diagnosis not present

## 2017-06-24 DIAGNOSIS — G894 Chronic pain syndrome: Secondary | ICD-10-CM | POA: Diagnosis not present

## 2017-06-24 DIAGNOSIS — Z720 Tobacco use: Secondary | ICD-10-CM | POA: Diagnosis not present

## 2017-06-30 DIAGNOSIS — F250 Schizoaffective disorder, bipolar type: Secondary | ICD-10-CM | POA: Diagnosis not present

## 2017-07-08 DIAGNOSIS — E669 Obesity, unspecified: Secondary | ICD-10-CM | POA: Diagnosis not present

## 2017-07-08 DIAGNOSIS — I10 Essential (primary) hypertension: Secondary | ICD-10-CM | POA: Diagnosis not present

## 2017-07-08 DIAGNOSIS — E1140 Type 2 diabetes mellitus with diabetic neuropathy, unspecified: Secondary | ICD-10-CM | POA: Diagnosis not present

## 2017-07-08 DIAGNOSIS — M1990 Unspecified osteoarthritis, unspecified site: Secondary | ICD-10-CM | POA: Diagnosis not present

## 2017-07-11 DIAGNOSIS — M545 Low back pain: Secondary | ICD-10-CM | POA: Diagnosis not present

## 2017-07-11 DIAGNOSIS — M47816 Spondylosis without myelopathy or radiculopathy, lumbar region: Secondary | ICD-10-CM | POA: Diagnosis not present

## 2017-07-22 DIAGNOSIS — M545 Low back pain: Secondary | ICD-10-CM | POA: Diagnosis not present

## 2017-07-22 DIAGNOSIS — M47816 Spondylosis without myelopathy or radiculopathy, lumbar region: Secondary | ICD-10-CM | POA: Diagnosis not present

## 2017-07-22 DIAGNOSIS — G894 Chronic pain syndrome: Secondary | ICD-10-CM | POA: Diagnosis not present

## 2017-07-22 DIAGNOSIS — Z720 Tobacco use: Secondary | ICD-10-CM | POA: Diagnosis not present

## 2017-07-22 DIAGNOSIS — J449 Chronic obstructive pulmonary disease, unspecified: Secondary | ICD-10-CM | POA: Diagnosis not present

## 2017-07-31 DIAGNOSIS — F259 Schizoaffective disorder, unspecified: Secondary | ICD-10-CM | POA: Diagnosis not present

## 2017-07-31 DIAGNOSIS — E119 Type 2 diabetes mellitus without complications: Secondary | ICD-10-CM | POA: Diagnosis not present

## 2017-07-31 DIAGNOSIS — M549 Dorsalgia, unspecified: Secondary | ICD-10-CM | POA: Diagnosis not present

## 2017-07-31 DIAGNOSIS — G8929 Other chronic pain: Secondary | ICD-10-CM | POA: Diagnosis not present

## 2017-07-31 DIAGNOSIS — Z7984 Long term (current) use of oral hypoglycemic drugs: Secondary | ICD-10-CM | POA: Diagnosis not present

## 2017-07-31 DIAGNOSIS — F17210 Nicotine dependence, cigarettes, uncomplicated: Secondary | ICD-10-CM | POA: Diagnosis not present

## 2017-07-31 DIAGNOSIS — M069 Rheumatoid arthritis, unspecified: Secondary | ICD-10-CM | POA: Diagnosis not present

## 2017-07-31 DIAGNOSIS — Z8673 Personal history of transient ischemic attack (TIA), and cerebral infarction without residual deficits: Secondary | ICD-10-CM | POA: Diagnosis not present

## 2017-07-31 DIAGNOSIS — Z794 Long term (current) use of insulin: Secondary | ICD-10-CM | POA: Diagnosis not present

## 2017-07-31 DIAGNOSIS — J449 Chronic obstructive pulmonary disease, unspecified: Secondary | ICD-10-CM | POA: Diagnosis not present

## 2017-07-31 DIAGNOSIS — R45850 Homicidal ideations: Secondary | ICD-10-CM | POA: Diagnosis not present

## 2017-07-31 DIAGNOSIS — F419 Anxiety disorder, unspecified: Secondary | ICD-10-CM | POA: Diagnosis not present

## 2017-07-31 DIAGNOSIS — F332 Major depressive disorder, recurrent severe without psychotic features: Secondary | ICD-10-CM | POA: Diagnosis not present

## 2017-07-31 DIAGNOSIS — R45851 Suicidal ideations: Secondary | ICD-10-CM | POA: Diagnosis not present

## 2017-07-31 DIAGNOSIS — I10 Essential (primary) hypertension: Secondary | ICD-10-CM | POA: Diagnosis not present

## 2017-07-31 DIAGNOSIS — Z79899 Other long term (current) drug therapy: Secondary | ICD-10-CM | POA: Diagnosis not present

## 2017-08-05 DIAGNOSIS — E1140 Type 2 diabetes mellitus with diabetic neuropathy, unspecified: Secondary | ICD-10-CM | POA: Diagnosis not present

## 2017-08-05 DIAGNOSIS — I10 Essential (primary) hypertension: Secondary | ICD-10-CM | POA: Diagnosis not present

## 2017-08-05 DIAGNOSIS — J449 Chronic obstructive pulmonary disease, unspecified: Secondary | ICD-10-CM | POA: Diagnosis not present

## 2017-08-05 DIAGNOSIS — E669 Obesity, unspecified: Secondary | ICD-10-CM | POA: Diagnosis not present

## 2017-08-05 DIAGNOSIS — E782 Mixed hyperlipidemia: Secondary | ICD-10-CM | POA: Diagnosis not present

## 2017-08-05 DIAGNOSIS — M1990 Unspecified osteoarthritis, unspecified site: Secondary | ICD-10-CM | POA: Diagnosis not present

## 2017-10-15 DIAGNOSIS — E669 Obesity, unspecified: Secondary | ICD-10-CM | POA: Diagnosis not present

## 2017-10-15 DIAGNOSIS — E1140 Type 2 diabetes mellitus with diabetic neuropathy, unspecified: Secondary | ICD-10-CM | POA: Diagnosis not present

## 2017-10-15 DIAGNOSIS — M1990 Unspecified osteoarthritis, unspecified site: Secondary | ICD-10-CM | POA: Diagnosis not present
